# Patient Record
Sex: Male | Born: 1970 | Race: Black or African American | Hispanic: No | Smoking: Former smoker
Health system: Southern US, Community
[De-identification: ages and names within clinical notes are randomized; demographics above are authoritative.]

## PROBLEM LIST (undated history)

## (undated) DIAGNOSIS — R7309 Other abnormal glucose: Secondary | ICD-10-CM

## (undated) DIAGNOSIS — J309 Allergic rhinitis, unspecified: Secondary | ICD-10-CM

## (undated) DIAGNOSIS — M545 Low back pain, unspecified: Secondary | ICD-10-CM

## (undated) DIAGNOSIS — I10 Essential (primary) hypertension: Secondary | ICD-10-CM

## (undated) DIAGNOSIS — E7800 Pure hypercholesterolemia, unspecified: Secondary | ICD-10-CM

## (undated) DIAGNOSIS — M1990 Unspecified osteoarthritis, unspecified site: Secondary | ICD-10-CM

## (undated) HISTORY — DX: Low back pain: M54.5

## (undated) HISTORY — PX: NO PAST SURGERIES: SHX2092

## (undated) HISTORY — DX: Low back pain, unspecified: M54.50

## (undated) HISTORY — PX: COLONOSCOPY: SHX174

## (undated) HISTORY — DX: Allergic rhinitis, unspecified: J30.9

## (undated) HISTORY — DX: Unspecified osteoarthritis, unspecified site: M19.90

---

## 2006-06-02 ENCOUNTER — Ambulatory Visit: Payer: Self-pay | Admitting: Gastroenterology

## 2006-06-12 ENCOUNTER — Ambulatory Visit: Payer: Self-pay | Admitting: Gastroenterology

## 2006-06-12 ENCOUNTER — Encounter (INDEPENDENT_AMBULATORY_CARE_PROVIDER_SITE_OTHER): Payer: Self-pay | Admitting: *Deleted

## 2006-07-14 ENCOUNTER — Ambulatory Visit: Payer: Self-pay | Admitting: Gastroenterology

## 2007-08-06 ENCOUNTER — Emergency Department (HOSPITAL_COMMUNITY): Admission: EM | Admit: 2007-08-06 | Discharge: 2007-08-06 | Payer: Self-pay | Admitting: Emergency Medicine

## 2010-08-30 NOTE — Assessment & Plan Note (Signed)
[facility]                         GASTROENTEROLOGY OFFICE NOTE   [name] [name]                          MRN:          [id number]  DATE:06/02/2006                            DOB:          11-08-1970    REFERRING PHYSICIAN:  [name]   REASON FOR REFERRAL:  Physician assistant [name] [name] asked me to evaluate  [name] in consultation regarding intermittent rectal bleeding.   HISTORY OF PRESENT ILLNESS:  [name] is a very pleasant 39 year old  [place] born man who has had intermittent bright red blood per rectum  for the past month or so.  He says he does once in a while have to  strain to move his bowels but this is not too common. He has not had  diarrhea nor dramatic constipation.  He began having what sounded like  small volume bleeding on the tissue paper and a drip or two into the  toilet at the time of bowel movements.  He has never had this before.  He did have some abdominal pains in 2007 and was evaluated by a doctor  in [place] with an EGD.  I have a report saying that some mild  gastritis was seen on this May 2007 EGD.  He also had an ultrasound in  April 2007 that was essentially normal.   He feels very well, has had no melena, no hematochezia, no nausea, no  vomiting.   REVIEW OF SYSTEMS:  Noted for a 20 pound weight gain in the past year  and is otherwise essentially normal and is available on his nursing  intake sheet.   PAST MEDICAL HISTORY:  Hypertension, elevated cholesterol.   CURRENT MEDICINES:  Lipitor.   ALLERGIES:  No known drug allergies.   SOCIAL HISTORY:  Separated from his wife.  Lives by himself.  Nonsmoker,  drinks alcohol once or twice a week.   FAMILY HISTORY:  Mother with diabetes.  No colon cancer in family.   PHYSICAL EXAMINATION:  Height 6 foot, 0 inches, 215 pounds. Blood  pressure 120/90.  Pulse 64.  CONSTITUTIONAL:  Generally well-appearing.  NEUROLOGIC:  Alert  and oriented x3.  EYES:  Extraocular movements intact.  MOUTH:  Oropharynx moist, no lesions.  NECK:  Is supple, no lymphadenopathy.  CARDIOVASCULAR:  Heart regular rate and rhythm.  LUNGS:  Clear to auscultation bilaterally.  ABDOMEN:  Soft, nontender, nondistended, normal bowel sounds.  EXTREMITIES:  No lower extremity edema.  SKIN:  No rashes or lesions on visible extremities.   ASSESSMENT AND PLAN:  A 40 year old man with minor rectal bleeding.   We have not yet received CBC from the [facility].  He does not  appear anemic, but I want to document that to make sure.  Most likely  this is hemorrhoidal bleeding or possibly a small anal fissure.  To be  certain we should proceed with colonoscopy at his soonest convenience.  He has already started some type of an herbal supplement that his mother  recommended and he says on this he has seen definitely less bleeding.  I see no reason for any further blood tests or imaging studies as long  as we get the CBC above, if not, I think we should document whether or  not he is anemic.     [name] Fee, MD  Electronically Signed    DPJ/MedQ  DD: 06/02/2006  DT: 06/03/2006  Job #: [id number]   cc:   A. [name] [name], [place]

## 2010-08-30 NOTE — Assessment & Plan Note (Signed)
[facility]                         GASTROENTEROLOGY OFFICE NOTE   NAME:[name], [name]                          MRN:          [id number]  DATE:07/14/2006                            DOB:          1970-10-31    GI PROBLEM LIST:  1. Mild proctitis up to 5 cm from anus. Biopsy showed chronic active      proctocolitis this was on colonoscopy February 2008, done for      intermittent rectal bleeding. Canasa once nightly greatly helped.   INTERVAL HISTORY:  I last saw [name] at the time of his colonoscopy.  I found 5 cm of proctitis as described above. Biopsies proved chronic  component. His symptoms greatly improved with Canasa suppositories once  nightly.   CURRENT MEDICATIONS:  Canasa 1 gram p.r. q nightly.   PHYSICAL EXAMINATION:  Blood pressure is 118/84, pulse 72.  CONSTITUTIONAL: Generally well-appearing.  ABDOMEN: Soft and nontender, nondistended. Normal bowel sounds.   ASSESSMENT/PLAN:  A 40 year old man with 5-cm of proctitis.   Symptoms have resolved on once nightly Canasa. He will continue Canasa  suppositories every other night for a month and then back down to every  third night for a month. He will stop at that point. If he has a return  of his bleeding, he will call me and likely I will put him back on every  night Canasa. Otherwise, he will return to see me on an as needed basis.     Rachael Fee, MD  Electronically Signed    DPJ/MedQ  DD: 07/14/2006  DT: 07/14/2006  Job #: [id number]

## 2011-07-03 ENCOUNTER — Ambulatory Visit: Payer: Self-pay | Admitting: Physician Assistant

## 2011-08-28 ENCOUNTER — Ambulatory Visit: Payer: 59

## 2011-08-28 ENCOUNTER — Ambulatory Visit (INDEPENDENT_AMBULATORY_CARE_PROVIDER_SITE_OTHER): Payer: 59 | Admitting: Family Medicine

## 2011-08-28 VITALS — BP 120/86 | HR 71 | Temp 97.6°F | Resp 16 | Ht 77.0 in | Wt 238.0 lb

## 2011-08-28 DIAGNOSIS — M79606 Pain in leg, unspecified: Secondary | ICD-10-CM | POA: Insufficient documentation

## 2011-08-28 DIAGNOSIS — K639 Disease of intestine, unspecified: Secondary | ICD-10-CM

## 2011-08-28 DIAGNOSIS — M25569 Pain in unspecified knee: Secondary | ICD-10-CM

## 2011-08-28 DIAGNOSIS — K6389 Other specified diseases of intestine: Secondary | ICD-10-CM

## 2011-08-28 DIAGNOSIS — M25559 Pain in unspecified hip: Secondary | ICD-10-CM

## 2011-08-28 MED ORDER — OXAPROZIN 600 MG PO TABS: 600.0000 mg | ORAL_TABLET | Freq: Two times a day (BID) | ORAL | Status: AC

## 2011-08-28 NOTE — Patient Instructions (Addendum)
USE MIRALAX ONE DOSE AS NEEDED FOR THE ABDOMINAL PAIN TO MAKE YOUR BOWELS MOVE   BELOW ARE SOME GENERAL KNEE EXERCISES.  GRADUALLY INCREASE WHAT YOU DO.  Knee Exercises EXERCISES RANGE OF MOTION(ROM) AND STRETCHING EXERCISES These exercises may help you when beginning to rehabilitate your injury. Your symptoms may resolve with or without further involvement from your physician, physical therapist or athletic trainer. While completing these exercises, remember:   Restoring tissue flexibility helps normal motion to return to the joints. This allows healthier, less painful movement and activity.   An effective stretch should be held for at least 30 seconds.   A stretch should never be painful. You should only feel a gentle lengthening or release in the stretched tissue.  STRETCH - Knee Extension, Prone  Lie on your stomach on a firm surface, such as a bed or countertop. Place your right / left knee and leg just beyond the edge of the surface. You may wish to place a towel under the far end of your right / left thigh for comfort.   Relax your leg muscles and allow gravity to straighten your knee. Your clinician may advise you to add an ankle weight if more resistance is helpful for you.   You should feel a stretch in the back of your right / left knee. Hold this position for __________ seconds.  Repeat __________ times. Complete this stretch __________ times per day. * Your physician, physical therapist or athletic trainer may ask you to add ankle weight to enhance your stretch.  RANGE OF MOTION - Knee Flexion, Active  Lie on your back with both knees straight. (If this causes back discomfort, bend your opposite knee, placing your foot flat on the floor.)   Slowly slide your heel back toward your buttocks until you feel a gentle stretch in the front of your knee or thigh.   Hold for __________ seconds. Slowly slide your heel back to the starting position.  Repeat __________ times.  Complete this exercise __________ times per day.  STRETCH - Quadriceps, Prone   Lie on your stomach on a firm surface, such as a bed or padded floor.   Bend your right / left knee and grasp your ankle. If you are unable to reach, your ankle or pant leg, use a belt around your foot to lengthen your reach.   Gently pull your heel toward your buttocks. Your knee should not slide out to the side. You should feel a stretch in the front of your thigh and/or knee.   Hold this position for __________ seconds.  Repeat __________ times. Complete this stretch __________ times per day.  STRETCH - Hamstrings, Supine   Lie on your back. Loop a belt or towel over the ball of your right / left foot.   Straighten your right / left knee and slowly pull on the belt to raise your leg. Do not allow the right / left knee to bend. Keep your opposite leg flat on the floor.   Raise the leg until you feel a gentle stretch behind your right / left knee or thigh. Hold this position for __________ seconds.  Repeat __________ times. Complete this stretch __________ times per day.  STRENGTHENING EXERCISES These exercises may help you when beginning to rehabilitate your injury. They may resolve your symptoms with or without further involvement from your physician, physical therapist or athletic trainer. While completing these exercises, remember:   Muscles can gain both the endurance and the strength needed for  everyday activities through controlled exercises.   Complete these exercises as instructed by your physician, physical therapist or athletic trainer. Progress the resistance and repetitions only as guided.   You may experience muscle soreness or fatigue, but the pain or discomfort you are trying to eliminate should never worsen during these exercises. If this pain does worsen, stop and make certain you are following the directions exactly. If the pain is still present after adjustments, discontinue the exercise  until you can discuss the trouble with your clinician.  STRENGTH - Quadriceps, Isometrics  Lie on your back with your right / left leg extended and your opposite knee bent.   Gradually tense the muscles in the front of your right / left thigh. You should see either your knee cap slide up toward your hip or increased dimpling just above the knee. This motion will push the back of the knee down toward the floor/mat/bed on which you are lying.   Hold the muscle as tight as you can without increasing your pain for __________ seconds.   Relax the muscles slowly and completely in between each repetition.  Repeat __________ times. Complete this exercise __________ times per day.  STRENGTH - Quadriceps, Short Arcs   Lie on your back. Place a __________ inch towel roll under your knee so that the knee slightly bends.   Raise only your lower leg by tightening the muscles in the front of your thigh. Do not allow your thigh to rise.   Hold this position for __________ seconds.  Repeat __________ times. Complete this exercise __________ times per day.  OPTIONAL ANKLE WEIGHTS: Begin with ____________________, but DO NOT exceed ____________________. Increase in 1 pound/0.5 kilogram increments.  STRENGTH - Quadriceps, Straight Leg Raises  Quality counts! Watch for signs that the quadriceps muscle is working to insure you are strengthening the correct muscles and not "cheating" by substituting with healthier muscles.  Lay on your back with your right / left leg extended and your opposite knee bent.   Tense the muscles in the front of your right / left thigh. You should see either your knee cap slide up or increased dimpling just above the knee. Your thigh may even quiver.   Tighten these muscles even more and raise your leg 4 to 6 inches off the floor. Hold for __________ seconds.   Keeping these muscles tense, lower your leg.   Relax the muscles slowly and completely in between each repetition.    Repeat __________ times. Complete this exercise __________ times per day.  STRENGTH - Hamstring, Curls  Lay on your stomach with your legs extended. (If you lay on a bed, your feet may hang over the edge.)   Tighten the muscles in the back of your thigh to bend your right / left knee up to 90 degrees. Keep your hips flat on the bed/floor.   Hold this position for __________ seconds.   Slowly lower your leg back to the starting position.  Repeat __________ times. Complete this exercise __________ times per day.  OPTIONAL ANKLE WEIGHTS: Begin with ____________________, but DO NOT exceed ____________________. Increase in 1 pound/0.5 kilogram increments.  STRENGTH - Quadriceps, Squats  Stand in a door frame so that your feet and knees are in line with the frame.   Use your hands for balance, not support, on the frame.   Slowly lower your weight, bending at the hips and knees. Keep your lower legs upright so that they are parallel with the door frame. Squat  only within the range that does not increase your knee pain. Never let your hips drop below your knees.   Slowly return upright, pushing with your legs, not pulling with your hands.  Repeat __________ times. Complete this exercise __________ times per day.  STRENGTH - Quadriceps, Wall Slides  Follow guidelines for form closely. Increased knee pain often results from poorly placed feet or knees.  Lean against a smooth wall or door and walk your feet out 18-24 inches. Place your feet hip-width apart.   Slowly slide down the wall or door until your knees bend __________ degrees.* Keep your knees over your heels, not your toes, and in line with your hips, not falling to either side.   Hold for __________ seconds. Stand up to rest for __________ seconds in between each repetition.  Repeat __________ times. Complete this exercise __________ times per day. * Your physician, physical therapist or athletic trainer will alter this angle based on  your symptoms and progress. Document Released: 02/12/2005 Document Revised: 03/20/2011 Document Reviewed: 07/13/2008 [facility] Patient Information 2012 [place], [place].

## 2011-08-28 NOTE — Progress Notes (Signed)
Subjective: Left knee is hurting the patient. He does not do a lot of vigorous physical activity more. It hurts when he stands for a while. Getting up and down does not seem to cause him a lot of trouble. It is not giving him trouble when he is laying down. He worries some about his left hip because it also is a little tight. 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 He does well at that.  Objective: Left hip has decreased abduction. It's a little bit painful. The left knee hurts him in certain positions, but is nontender. There is one possibility flexes the knee.  Assessment: Left knee pain Left hip pain and decreased motion  Plan: X-ray knee and hip  [facility] reading (PRIMARY) by  Dr. Alwyn Ren  Hip on left is okay.  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   Left knee normal  He knows hip looks bad but doen't desire rx since it doesn't hurt NSAID for knee.

## 2013-11-01 ENCOUNTER — Ambulatory Visit (INDEPENDENT_AMBULATORY_CARE_PROVIDER_SITE_OTHER): Payer: 59 | Admitting: Family Medicine

## 2013-11-01 VITALS — BP 108/80 | HR 73 | Temp 98.6°F | Resp 16 | Ht 76.0 in | Wt 234.0 lb

## 2013-11-01 DIAGNOSIS — R05 Cough: Secondary | ICD-10-CM

## 2013-11-01 DIAGNOSIS — M545 Low back pain, unspecified: Secondary | ICD-10-CM

## 2013-11-01 DIAGNOSIS — G8929 Other chronic pain: Secondary | ICD-10-CM | POA: Insufficient documentation

## 2013-11-01 DIAGNOSIS — R059 Cough, unspecified: Secondary | ICD-10-CM

## 2013-11-01 MED ORDER — BENZONATATE 100 MG PO CAPS: 100.0000 mg | ORAL_CAPSULE | Freq: Three times a day (TID) | ORAL | Status: DC | PRN

## 2013-11-01 MED ORDER — DICLOFENAC SODIUM 75 MG PO TBEC: 75.0000 mg | DELAYED_RELEASE_TABLET | Freq: Two times a day (BID) | ORAL | Status: DC

## 2013-11-01 MED ORDER — ALBUTEROL SULFATE HFA 108 (90 BASE) MCG/ACT IN AERS: 2.0000 | INHALATION_SPRAY | Freq: Four times a day (QID) | RESPIRATORY_TRACT | Status: DC | PRN

## 2013-11-01 NOTE — Patient Instructions (Signed)
Use the inhaler 2 inhalations every 4-6 hours  Read the booklet on back on her manual  Take diclofenac one twice daily  Call if worse

## 2013-11-01 NOTE — Progress Notes (Signed)
Subjective: Patient is here for a couple of things. He's had a cough for about 4 days. He's not having a production of it. It mostly as a daytime cough but very nagging. No fever. No sore throat. No upper congestion.  He also has been having problems with pain in his low back. Especially when he sits all day at work for 12 hours he is hurting there.  Objective: Tender in the low lumbar spine midline right at the base of the spine. Good range of motion. Deep tendon reflexes and normal. Throat are negative.  TMs are normal. Throat clear. Neck supple without nodes. Chest clear. Mild prolonged expiratory phase. Heart regular without murmurs.  Assessment: Cough with mild bronchospasm Back pain  Plan: Diclofenac one twice daily Tessalon for the cough Care of back instructions If he starts to bring up a lot of phlegm and calls back I will call in an antibiotic for him, but told him he did not eat today.

## 2013-11-11 ENCOUNTER — Telehealth: Payer: Self-pay

## 2013-11-11 NOTE — Telephone Encounter (Signed)
Dr. Linna Darner    Patient states he is no better and requests antibiotics.   [facility] [address]   [phone]

## 2013-11-13 MED ORDER — MUCINEX DM MAXIMUM STRENGTH 60-1200 MG PO TB12: 1.0000 | ORAL_TABLET | Freq: Two times a day (BID) | ORAL | Status: DC

## 2013-11-13 MED ORDER — AZITHROMYCIN 250 MG PO TABS: ORAL_TABLET | ORAL | Status: AC

## 2013-11-13 NOTE — Telephone Encounter (Signed)
I have sent abx to the pharmacy for the patient.  I also sent some Mucinex DM to help get the congestion out of the chest.

## 2015-03-29 ENCOUNTER — Ambulatory Visit (INDEPENDENT_AMBULATORY_CARE_PROVIDER_SITE_OTHER): Payer: 59 | Admitting: Emergency Medicine

## 2015-03-29 VITALS — BP 122/84 | HR 65 | Temp 98.3°F | Resp 16 | Ht 76.5 in | Wt 244.0 lb

## 2015-03-29 DIAGNOSIS — Z23 Encounter for immunization: Secondary | ICD-10-CM

## 2015-03-29 DIAGNOSIS — J0140 Acute pansinusitis, unspecified: Secondary | ICD-10-CM

## 2015-03-29 DIAGNOSIS — M7552 Bursitis of left shoulder: Secondary | ICD-10-CM

## 2015-03-29 DIAGNOSIS — M7550 Bursitis of unspecified shoulder: Secondary | ICD-10-CM | POA: Insufficient documentation

## 2015-03-29 MED ORDER — AMOXICILLIN-POT CLAVULANATE 875-125 MG PO TABS: 1.0000 | ORAL_TABLET | Freq: Two times a day (BID) | ORAL | Status: DC

## 2015-03-29 MED ORDER — PSEUDOEPHEDRINE-GUAIFENESIN ER 60-600 MG PO TB12: 1.0000 | ORAL_TABLET | Freq: Two times a day (BID) | ORAL | Status: DC

## 2015-03-29 MED ORDER — NAPROXEN SODIUM 550 MG PO TABS: 550.0000 mg | ORAL_TABLET | Freq: Two times a day (BID) | ORAL | Status: AC

## 2015-03-29 NOTE — Progress Notes (Signed)
Subjective:  Patient ID: [name] [name], male    DOB: 01-07-1971  Age: 44 y.o. MRN: [id number]  CC: Shoulder Pain   HPI [name] [name] presents   With nasal congestion postnasal drainage  Pertinent with purulent nasal drainage. He has a sore throat.. No cough fever chills nausea vomiting. 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 No history of direct injury but he does  Very physical work out said the pain in his shoulder for weeks  History [name] [name] has no past medical history on file.   He has no past surgical history on file.   His  family history is not on file.  He   reports that he has never smoked. He does not have any smokeless tobacco history on file. He reports that he drinks alcohol. His drug history is not on file.  Outpatient Prescriptions Prior to Visit  Medication Sig Dispense Refill  . albuterol (PROVENTIL HFA;VENTOLIN HFA) 108 (90 BASE) MCG/ACT inhaler Inhale 2 puffs into the lungs every 6 (six) hours as needed for wheezing or shortness of breath (cough, shortness of breath or wheezing.). (Patient not taking: Reported on 03/29/2015) 1 Inhaler 1  . benzonatate (TESSALON) 100 MG capsule Take 1-2 capsules (100-200 mg total) by mouth 3 (three) times daily as needed. (Patient not taking: Reported on 03/29/2015) 30 capsule 0  . Dextromethorphan-Guaifenesin (MUCINEX DM MAXIMUM STRENGTH) 60-1200 MG TB12 Take 1 tablet by mouth 2 (two) times daily. (Patient not taking: Reported on 03/29/2015) 14 each 0  . diclofenac (VOLTAREN) 75 MG EC tablet Take 1 tablet (75 mg total) by mouth 2 (two) times daily. (Patient not taking: Reported on 03/29/2015) 30 tablet 0   No facility-administered medications prior to visit.    Social History   Social History  . Marital Status: Single    Spouse Name: N/A  . Number of Children: N/A  . Years of Education: N/A   Social History Main Topics  . Smoking status: Never Smoker   . Smokeless tobacco: None  .  Alcohol Use: Yes     Comment: social  . Drug Use: None  . Sexual Activity: Not Asked   Other Topics Concern  . None   Social History Narrative     Review of Systems  Constitutional: Negative for fever, chills and appetite change.  HENT: Positive for congestion, postnasal drip, rhinorrhea, sinus pressure and sore throat. Negative for ear pain.   Eyes: Negative for pain and redness.  Respiratory: Negative for cough, shortness of breath and wheezing.   Cardiovascular: Negative for leg swelling.  Gastrointestinal: Negative for nausea, vomiting, abdominal pain, diarrhea, constipation and blood in stool.  Endocrine: Negative for polyuria.  Genitourinary: Negative for dysuria, urgency, frequency and flank pain.  Musculoskeletal: Negative for gait problem.  Skin: Negative for rash.  Neurological: Negative for weakness and headaches.  Psychiatric/Behavioral: Negative for confusion and decreased concentration. The patient is not nervous/anxious.     Objective:  BP 122/84 mmHg  Pulse 65  Temp(Src) 98.3 F (36.8 C)  Resp 16  Ht 6' 4.5" (1.943 m)  Wt 244 lb (110.678 kg)  BMI 29.32 kg/m2  SpO2 98%  Physical Exam  Constitutional: He is oriented to person, place, and time. He appears well-developed and well-nourished.  HENT:  Head: Normocephalic and atraumatic.  Eyes: Conjunctivae are normal. Pupils are equal, round, and reactive to light.  Pulmonary/Chest: Effort normal.  Musculoskeletal: He exhibits no edema.  Left shoulder: He exhibits decreased range of motion and tenderness.  Neurological: He is alert and oriented to person, place, and time.  Skin: Skin is dry.  Psychiatric: He has a normal mood and affect. His behavior is normal. Thought content normal.      Assessment & Plan:   [name] [name] was seen today for shoulder pain.  Diagnoses and all orders for this visit:  Acute pansinusitis, recurrence not specified  Shoulder bursitis, left  Needs flu shot -     Flu  Vaccine QUAD 36+ mos IM  Other orders -     naproxen sodium (ANAPROX DS) 550 MG tablet; Take 1 tablet (550 mg total) by mouth 2 (two) times daily with a meal. -     amoxicillin-clavulanate (AUGMENTIN) 875-125 MG tablet; Take 1 tablet by mouth 2 (two) times daily. -     pseudoephedrine-guaifenesin (MUCINEX D) 60-600 MG 12 hr tablet; Take 1 tablet by mouth every 12 (twelve) hours.  I am having [name] [name] start on naproxen sodium, amoxicillin-clavulanate, and pseudoephedrine-guaifenesin. I am also having him maintain his albuterol, benzonatate, diclofenac, and MUCINEX DM MAXIMUM STRENGTH.  Meds ordered this encounter  Medications  . naproxen sodium (ANAPROX DS) 550 MG tablet    Sig: Take 1 tablet (550 mg total) by mouth 2 (two) times daily with a meal.    Dispense:  40 tablet    Refill:  0  . amoxicillin-clavulanate (AUGMENTIN) 875-125 MG tablet    Sig: Take 1 tablet by mouth 2 (two) times daily.    Dispense:  20 tablet    Refill:  0  . pseudoephedrine-guaifenesin (MUCINEX D) 60-600 MG 12 hr tablet    Sig: Take 1 tablet by mouth every 12 (twelve) hours.    Dispense:  18 tablet    Refill:  0    Appropriate red flag conditions were discussed with the patient as well as actions that should be taken.  Patient expressed his understanding.  Follow-up: Return if symptoms worsen or fail to improve.  [name] Culver, MD

## 2015-03-29 NOTE — Patient Instructions (Signed)
Bursitis °Bursitis is inflammation and irritation of a bursa, which is one of the small, fluid-filled sacs that cushion and protect the moving parts of your body. These sacs are located between bones and muscles, muscle attachments, or skin areas next to bones. A bursa protects these structures from the wear and tear that results from frequent movement. °An inflamed bursa causes pain and swelling. Fluid may build up inside the sac. Bursitis is most common near joints, especially the knees, elbows, hips, and shoulders. °CAUSES °Bursitis can be caused by:  °· Injury from: °¨ A direct blow, like falling on your knee or elbow. °¨ Overuse of a joint (repetitive stress). °· Infection. This can happen if bacteria gets into a bursa through a cut or scrape near a joint. °· Diseases that cause joint inflammation, such as gout and rheumatoid arthritis. °RISK FACTORS °You may be at risk for bursitis if you:  °· Have a job or hobby that involves a lot of repetitive stress on your joints. °· Have a condition that weakens your body's defense system (immune system), such as diabetes, cancer, or HIV. °· Lift and reach overhead often. °· Kneel or lean on hard surfaces often. °· Run or walk often. °SIGNS AND SYMPTOMS °The most common signs and symptoms of bursitis are: °· Pain that gets worse when you move the affected body part or put weight on it. °· Inflammation. °· Stiffness. °Other signs and symptoms may include: °· Redness. °· Tenderness. °· Warmth. °· Pain that continues after rest. °· Fever and chills. This may occur in bursitis caused by infection. °DIAGNOSIS °Bursitis may be diagnosed by:  °· Medical history and physical exam. °· MRI. °· A procedure to drain fluid from the bursa with a needle (aspiration). The fluid may be checked for signs of infection or gout. °· Blood tests to rule out other causes of inflammation. °TREATMENT  °Bursitis can usually be treated at home with rest, ice, compression, and elevation (RICE). For  mild bursitis, RICE treatment may be all you need. Other treatments may include: °· Nonsteroidal anti-inflammatory drugs (NSAIDs) to treat pain and inflammation. °· Corticosteroids to fight inflammation. You may have these drugs injected into and around the area of bursitis. °· Aspiration of bursitis fluid to relieve pain and improve movement. °· Antibiotic medicine to treat an infected bursa. °· A splint, brace, or walking aid. °· Physical therapy if you continue to have pain or limited movement. °· Surgery to remove a damaged or infected bursa. This may be needed if you have a very bad case of bursitis or if other treatments have not worked. °HOME CARE INSTRUCTIONS  °· Take medicines only as directed by your health care provider. °· If you were prescribed an antibiotic medicine, finish it all even if you start to feel better. °· Rest the affected area as directed by your health care provider. °¨ Keep the area elevated. °¨ Avoid activities that make pain worse. °· Apply ice to the injured area: °¨ Place ice in a plastic bag. °¨ Place a towel between your skin and the bag. °¨ Leave the ice on for 20 minutes, 2-3 times a day. °· Use splints, braces, pads, or walking aids as directed by your health care provider. °· Keep all follow-up visits as directed by your health care provider. This is important. °PREVENTION  °· Wear knee pads if you kneel often. °· Wear sturdy running or walking shoes that fit you well. °· Take regular breaks from repetitive activity. °· Warm   up by stretching before doing any strenuous activity. °· Maintain a healthy weight or lose weight as recommended by your health care provider. Ask your health care provider if you need help. °· Exercise regularly. Start any new physical activity gradually. °SEEK MEDICAL CARE IF:  °· Your bursitis is not responding to treatment or home care. °· You have a fever. °· You have chills. °  °This information is not intended to replace advice given to you by your  health care provider. Make sure you discuss any questions you have with your health care provider. °  °Document Released: 03/28/2000 Document Revised: 12/20/2014 Document Reviewed: 06/20/2013 °Elsevier Interactive Patient Education ©2016 [employer] ° °

## 2015-08-17 ENCOUNTER — Ambulatory Visit (INDEPENDENT_AMBULATORY_CARE_PROVIDER_SITE_OTHER): Payer: 59 | Admitting: Family Medicine

## 2015-08-17 VITALS — BP 144/84 | HR 78 | Temp 98.5°F | Resp 16 | Ht 76.5 in | Wt 239.2 lb

## 2015-08-17 DIAGNOSIS — M25559 Pain in unspecified hip: Secondary | ICD-10-CM | POA: Diagnosis not present

## 2015-08-17 MED ORDER — PREDNISONE 5 MG (48) PO TBPK: ORAL_TABLET | ORAL | Status: DC

## 2015-08-17 NOTE — Patient Instructions (Addendum)
Thank you for coming in today. Take prednisone daily for 12 days.  Return if not better.  Come back or go to the emergency room if you notice new weakness new numbness problems walking or bowel or bladder problems.      IF you received an x-ray today, you will receive an invoice from [facility]. Please contact [facility] at [phone] with questions or concerns regarding your invoice.   IF you received labwork today, you will receive an invoice from [employer]. Please contact [facility] at [phone] with questions or concerns regarding your invoice.   Our billing staff will not be able to assist you with questions regarding bills from these companies.  You will be contacted with the lab results as soon as they are available. The fastest way to get your results is to activate your My Chart account. Instructions are located on the last page of this paperwork. If you have not heard from [place] regarding the results in 2 weeks, please contact this office.

## 2015-08-17 NOTE — Progress Notes (Signed)
[name] [name] is a 45 y.o. male who presents to Urgent Care today for back pain. Patient has bilateral thigh pain. He denies any pain radiating below his knee weakness or numbness bowel bladder dysfunction or difficulty walking. He has not tried any medicines aside from acetaminophen. Symptoms are worse with prolonged standing and laying down. He feels well otherwise.   No past medical history on file. No past surgical history on file. Social History  Substance Use Topics  . Smoking status: Never Smoker   . Smokeless tobacco: Not on file  . Alcohol Use: Yes     Comment: social   ROS as above Medications: Current Outpatient Prescriptions  Medication Sig Dispense Refill  . naproxen sodium (ANAPROX DS) 550 MG tablet Take 1 tablet (550 mg total) by mouth 2 (two) times daily with a meal. 40 tablet 0  . predniSONE (STERAPRED UNI-PAK 48 TAB) 5 MG (48) TBPK tablet 12 day dosepack po 48 tablet 0   No current facility-administered medications for this visit.   No Known Allergies   Exam:  BP 144/84 mmHg  Pulse 78  Temp(Src) 98.5 F (36.9 C) (Oral)  Resp 16  Ht 6' 4.5" (1.943 m)  Wt 239 lb 3.2 oz (108.5 kg)  BMI 28.74 kg/m2  SpO2 98% Gen: Well NAD Back: Nontender to midline normal back motion Negative slump test. Lower extremity strength is equal and normal throughout. Lower extremity reflexes are equal and normal throughout. Sensation is intact throughout. Normal gait  No results found for this or any previous visit (from the past 24 hour(s)). No results found.  Assessment and Plan: 45 y.o. male with lumbago myofascial pain versus radiculopathy. Discussed options. Plan for trial of low-dose prednisone Dosepak. Return if not improved.  Discussed warning signs or symptoms. Please see discharge instructions. Patient expresses understanding.

## 2016-09-06 DIAGNOSIS — H04123 Dry eye syndrome of bilateral lacrimal glands: Secondary | ICD-10-CM | POA: Diagnosis not present

## 2016-09-06 DIAGNOSIS — H40033 Anatomical narrow angle, bilateral: Secondary | ICD-10-CM | POA: Diagnosis not present

## 2017-03-25 DIAGNOSIS — I7300 Raynaud's syndrome without gangrene: Secondary | ICD-10-CM | POA: Diagnosis not present

## 2017-07-01 ENCOUNTER — Other Ambulatory Visit: Payer: Self-pay

## 2017-07-01 ENCOUNTER — Encounter (HOSPITAL_COMMUNITY): Payer: Self-pay | Admitting: Emergency Medicine

## 2017-07-01 ENCOUNTER — Ambulatory Visit (HOSPITAL_COMMUNITY)
Admission: EM | Admit: 2017-07-01 | Discharge: 2017-07-01 | Disposition: A | Discharge status: Home / Self Care | Payer: 59 | Attending: Family Medicine | Admitting: Family Medicine

## 2017-07-01 DIAGNOSIS — Z79899 Other long term (current) drug therapy: Secondary | ICD-10-CM | POA: Diagnosis not present

## 2017-07-01 DIAGNOSIS — J069 Acute upper respiratory infection, unspecified: Secondary | ICD-10-CM | POA: Insufficient documentation

## 2017-07-01 DIAGNOSIS — R1084 Generalized abdominal pain: Secondary | ICD-10-CM | POA: Diagnosis not present

## 2017-07-01 DIAGNOSIS — B9789 Other viral agents as the cause of diseases classified elsewhere: Secondary | ICD-10-CM | POA: Diagnosis not present

## 2017-07-01 DIAGNOSIS — R197 Diarrhea, unspecified: Secondary | ICD-10-CM | POA: Diagnosis present

## 2017-07-01 DIAGNOSIS — R05 Cough: Secondary | ICD-10-CM | POA: Insufficient documentation

## 2017-07-01 LAB — POCT RAPID STREP A: Streptococcus, Group A Screen (Direct): NEGATIVE

## 2017-07-01 MED ORDER — CETIRIZINE HCL 10 MG PO CAPS: 10.0000 mg | ORAL_CAPSULE | Freq: Every day | ORAL | 0 refills | Status: DC

## 2017-07-01 MED ORDER — PSEUDOEPH-BROMPHEN-DM 30-2-10 MG/5ML PO SYRP: 5.0000 mL | ORAL_SOLUTION | Freq: Four times a day (QID) | ORAL | 0 refills | Status: AC | PRN

## 2017-07-01 MED ORDER — AMOXICILLIN-POT CLAVULANATE 875-125 MG PO TABS: 1.0000 | ORAL_TABLET | Freq: Two times a day (BID) | ORAL | 0 refills | Status: AC

## 2017-07-01 MED ORDER — FLUTICASONE PROPIONATE 50 MCG/ACT NA SUSP: 1.0000 | Freq: Every day | NASAL | 0 refills | Status: DC

## 2017-07-01 NOTE — Discharge Instructions (Signed)
Please begin daily zyrtec or claritin, store brand okay. You may try mucinex or flonase for congestion relief as well.   Please use cough syrup as needed.  You may fill antibiotic in 4-5 days if symptoms not improving.   Continue to monitor abdominal pain, take tylenol for this. Return if pain worsening, changing or not improving in 1 week.

## 2017-07-01 NOTE — ED Triage Notes (Signed)
Pt. No answer x 3

## 2017-07-01 NOTE — ED Provider Notes (Signed)
[facility]    CSN: [id number] Arrival date & time: 07/01/17  1549     History   Chief Complaint Chief Complaint  Patient presents with  . Diarrhea  . Facial Pain    HPI [name] [name] is a 47 y.o. male.  Presenting today with concern for congestion and abdominal pain.  Over the past 3-4 days he has had sinus pressure and congestion along with a sore throat.  His symptoms began last Friday.  He has tried Robitussin, Alka-Seltzer, 2 tablets of amoxicillin from previous sinusitis.  Tolerating oral intake, denies nausea or vomiting.  Patient had one episode of diarrhea on Monday, followed by regular bowel movements the past 2 days.  Is having some abdominal cramping as if he is constipated across his middle abdomen on both sides.  States that it is mildly improved from yesterday.  Drinks plenty of water, takes daily fiber supplement. HPI  History reviewed. No pertinent past medical history.  Patient Active Problem List   Diagnosis Date Noted  . Pain in joint, pelvic region and thigh 08/17/2015    History reviewed. No pertinent surgical history.     Home Medications    Prior to Admission medications   Medication Sig Start Date End Date Taking? Authorizing Provider  amoxicillin-clavulanate (AUGMENTIN) 875-125 MG tablet Take 1 tablet by mouth every 12 (twelve) hours for 10 days. 07/01/17 07/11/17  Donnie Gedeon C, PA-C  brompheniramine-pseudoephedrine-DM 30-2-10 MG/5ML syrup Take 5 mLs by mouth 4 (four) times daily as needed for up to 5 days. 07/01/17 07/06/17  Adalynd Donahoe C, PA-C  Cetirizine HCl 10 MG CAPS Take 1 capsule (10 mg total) by mouth daily for 10 days. 07/01/17 07/11/17  Nygeria Lager C, PA-C  fluticasone (FLONASE) 50 MCG/ACT nasal spray Place 1-2 sprays into both nostrils daily. 07/01/17   Haden Cavenaugh, Elesa Hacker, PA-C    Family History History reviewed. No pertinent family history.  Social History Social History   Tobacco Use  . Smoking status: Never  Smoker  . Smokeless tobacco: Never Used  Substance Use Topics  . Alcohol use: Yes    Comment: social  . Drug use: Not on file     Allergies   Patient has no known allergies.   Review of Systems Review of Systems  Constitutional: Negative for activity change, appetite change, fatigue and fever.  HENT: Positive for congestion, postnasal drip, rhinorrhea, sinus pressure and sore throat. Negative for ear pain.   Eyes: Negative for pain and itching.  Respiratory: Positive for cough. Negative for shortness of breath.   Cardiovascular: Negative for chest pain.  Gastrointestinal: Positive for abdominal pain. Negative for diarrhea, nausea and vomiting.  Musculoskeletal: Negative for myalgias.  Skin: Negative for rash.  Neurological: Negative for dizziness, light-headedness and headaches.     Physical Exam Triage Vital Signs ED Triage Vitals  Enc Vitals Group     BP 07/01/17 1859 (!) 146/107     Pulse Rate 07/01/17 1859 73     Resp --      Temp 07/01/17 1859 97.6 F (36.4 C)     Temp Source 07/01/17 1859 Oral     SpO2 07/01/17 1859 98 %     Weight --      Height --      Head Circumference --      Peak Flow --      Pain Score 07/01/17 1900 2     Pain Loc --      Pain Edu? --  Excl. in [place]? --    No data found.  Updated Vital Signs BP (!) 146/107 (BP Location: Left Arm)   Pulse 73   Temp 97.6 F (36.4 C) (Oral)   SpO2 98%   Visual Acuity Right Eye Distance:   Left Eye Distance:   Bilateral Distance:    Right Eye Near:   Left Eye Near:    Bilateral Near:     Physical Exam  Constitutional: He appears well-developed and well-nourished.  HENT:  Head: Normocephalic and atraumatic.  Bilateral TMs nonerythematous, nasal mucosa mildly erythematous with rhinorrhea present, posterior oropharynx erythematous with mild tonsillar enlargement, no exudate.  Eyes: Conjunctivae are normal.  Neck: Neck supple.  Cardiovascular: Normal rate and regular rhythm.  No murmur  heard. Pulmonary/Chest: Effort normal and breath sounds normal. No respiratory distress.  Breathing comfortably at rest, CTA BL  Abdominal: Soft. There is no tenderness.  Nontender to light and deep palpation  Musculoskeletal: He exhibits no edema.  Neurological: He is alert.  Skin: Skin is warm and dry.  Psychiatric: He has a normal mood and affect.  Nursing note and vitals reviewed.    UC Treatments / Results  Labs (all labs ordered are listed, but only abnormal results are displayed) Labs Reviewed  CULTURE, GROUP A STREP [facility])  POCT RAPID STREP A    EKG  EKG Interpretation None       Radiology No results found.  Procedures Procedures (including critical care time)  Medications Ordered in UC Medications - No data to display   Initial Impression / Assessment and Plan / UC Course  I have reviewed the triage vital signs and the nursing notes.  Pertinent labs & imaging results that were available during my care of the patient were reviewed by me and considered in my medical decision making (see chart for details).     Patient with likely viral URI symptoms.  Will recommend continuing symptomatic treatment.  Zyrtec, Flonase, cough syrup.  Patient requesting antibiotic, will provide printed prescription to fill in 5 days if symptoms not improving with recommendations today.  Abdominal pain does not appear peritoneal.  Slightly improved from earlier this week we will continue to monitor.  Will advise Tylenol to help with cramping. Discussed strict return precautions. Patient verbalized understanding and is agreeable with plan.   Final Clinical Impressions(s) / UC Diagnoses   Final diagnoses:  Generalized abdominal pain  Viral URI with cough    ED Discharge Orders        Ordered    Cetirizine HCl 10 MG CAPS  Daily     07/01/17 1926    brompheniramine-pseudoephedrine-DM 30-2-10 MG/5ML syrup  4 times daily PRN     07/01/17 1926    fluticasone (FLONASE) 50 MCG/ACT  nasal spray  Daily     07/01/17 1926    amoxicillin-clavulanate (AUGMENTIN) 875-125 MG tablet  Every 12 hours     07/01/17 1926       Controlled Substance Prescriptions [place] Controlled Substance Registry consulted? Not Applicable   Janith Lima, [place] 07/01/17 2132

## 2017-07-01 NOTE — ED Triage Notes (Signed)
C/o facial and sinus pressure and pain onset 2 days with chest congestion and cough. States started having diarrhea yesterday.

## 2017-07-04 LAB — CULTURE, GROUP A STREP (THRC)

## 2017-07-25 ENCOUNTER — Ambulatory Visit (HOSPITAL_COMMUNITY)
Admission: EM | Admit: 2017-07-25 | Discharge: 2017-07-25 | Disposition: A | Discharge status: Home / Self Care | Payer: 59 | Attending: Family Medicine | Admitting: Family Medicine

## 2017-07-25 ENCOUNTER — Encounter (HOSPITAL_COMMUNITY): Payer: Self-pay

## 2017-07-25 ENCOUNTER — Other Ambulatory Visit: Payer: Self-pay

## 2017-07-25 DIAGNOSIS — J320 Chronic maxillary sinusitis: Secondary | ICD-10-CM

## 2017-07-25 DIAGNOSIS — J019 Acute sinusitis, unspecified: Secondary | ICD-10-CM

## 2017-07-25 DIAGNOSIS — J302 Other seasonal allergic rhinitis: Secondary | ICD-10-CM

## 2017-07-25 MED ORDER — AZITHROMYCIN 250 MG PO TABS: ORAL_TABLET | ORAL | 0 refills | Status: DC

## 2017-07-25 NOTE — ED Provider Notes (Signed)
[facility]    CSN: [id number] Arrival date & time: 07/25/17  [place]     History   Chief Complaint Chief Complaint  Patient presents with  . URI    HPI [name] [name] is a 47 y.o. male.   Patient complains of sneezing coughing he has medicines left over from last time he was treated here including Augmentin Nasonex Allegra and Mucinex DM cough is mostly nonproductive but he is having drainage that I think may be affected by antihistamine causing it to become thicker with its drying effect.  HPI  History reviewed. No pertinent past medical history.  Patient Active Problem List   Diagnosis Date Noted  . Pain in joint, pelvic region and thigh 08/17/2015    History reviewed. No pertinent surgical history.     Home Medications    Prior to Admission medications   Medication Sig Start Date End Date Taking? Authorizing Provider  Cetirizine HCl 10 MG CAPS Take 1 capsule (10 mg total) by mouth daily for 10 days. 07/01/17 07/11/17  Wieters, Hallie C, PA-C  fluticasone (FLONASE) 50 MCG/ACT nasal spray Place 1-2 sprays into both nostrils daily. 07/01/17   Wieters, Elesa Hacker, PA-C    Family History History reviewed. No pertinent family history.  Social History Social History   Tobacco Use  . Smoking status: Never Smoker  . Smokeless tobacco: Never Used  Substance Use Topics  . Alcohol use: Yes    Comment: social  . Drug use: Not on file     Allergies   Patient has no known allergies.   Review of Systems Review of Systems  Constitutional: Negative.   HENT: Positive for congestion and postnasal drip.   Respiratory: Positive for cough.   Cardiovascular: Negative.   Gastrointestinal: Negative.   Neurological: Negative.      Physical Exam Triage Vital Signs ED Triage Vitals  Enc Vitals Group     BP 07/25/17 1725 (!) 140/99     Pulse Rate 07/25/17 1722 91     Resp 07/25/17 1722 18     Temp 07/25/17 1722 98 F (36.7 C)     Temp src --      SpO2  07/25/17 1722 100 %     Weight 07/25/17 1723 240 lb (108.9 kg)     Height --      Head Circumference --      Peak Flow --      Pain Score 07/25/17 1723 0     Pain Loc --      Pain Edu? --      Excl. in [place]? --    No data found.  Updated Vital Signs BP (!) 140/99   Pulse 91   Temp 98 F (36.7 C)   Resp 18   Wt 240 lb (108.9 kg)   SpO2 100%   BMI 28.83 kg/m   Visual Acuity Right Eye Distance:   Left Eye Distance:   Bilateral Distance:    Right Eye Near:   Left Eye Near:    Bilateral Near:     Physical Exam  Constitutional: He appears well-developed and well-nourished.  HENT:  Right Ear: External ear normal.  Left Ear: External ear normal.  Mouth/Throat: Oropharynx is clear and moist. No oropharyngeal exudate.  Cardiovascular: Normal rate and regular rhythm.  Pulmonary/Chest: Effort normal and breath sounds normal.     UC Treatments / Results  Labs (all labs ordered are listed, but only abnormal results are displayed) Labs Reviewed -  No data to display  EKG None Radiology No results found.  Procedures Procedures (including critical care time)  Medications Ordered in UC Medications - No data to display   Initial Impression / Assessment and Plan / UC Course  I have reviewed the triage vital signs and the nursing notes.  Pertinent labs & imaging results that were available during my care of the patient were reviewed by me and considered in my medical decision making (see chart for details).     Recurrent seasonal allergies and sinusitis.  Postnasal drainage causing a cough.  I have asked him to stop Allegra use Flonase and Mucinex and I will try him on a course of Zithromax  Final Clinical Impressions(s) / UC Diagnoses   Final diagnoses:  None    ED Discharge Orders    None       Controlled Substance Prescriptions [place] Controlled Substance Registry consulted? No   Wardell Honour, MD 07/25/17 1745

## 2017-07-25 NOTE — ED Triage Notes (Signed)
Pt complains of coughing sneezing and runny nose x 4 days.

## 2017-12-10 ENCOUNTER — Encounter: Payer: Self-pay | Admitting: Family Medicine

## 2017-12-10 ENCOUNTER — Other Ambulatory Visit: Payer: Self-pay

## 2017-12-10 ENCOUNTER — Ambulatory Visit: Payer: 59 | Attending: Family Medicine | Admitting: Family Medicine

## 2017-12-10 VITALS — BP 130/87 | HR 83 | Temp 97.7°F | Resp 18 | Ht 77.0 in | Wt 236.6 lb

## 2017-12-10 DIAGNOSIS — Z8249 Family history of ischemic heart disease and other diseases of the circulatory system: Secondary | ICD-10-CM | POA: Insufficient documentation

## 2017-12-10 DIAGNOSIS — Z23 Encounter for immunization: Secondary | ICD-10-CM | POA: Diagnosis not present

## 2017-12-10 DIAGNOSIS — G8929 Other chronic pain: Secondary | ICD-10-CM

## 2017-12-10 DIAGNOSIS — R202 Paresthesia of skin: Secondary | ICD-10-CM | POA: Diagnosis not present

## 2017-12-10 DIAGNOSIS — Z87891 Personal history of nicotine dependence: Secondary | ICD-10-CM | POA: Insufficient documentation

## 2017-12-10 DIAGNOSIS — M545 Low back pain: Secondary | ICD-10-CM

## 2017-12-10 MED ORDER — NAPROXEN 500 MG PO TABS: ORAL_TABLET | ORAL | 1 refills | Status: DC

## 2017-12-10 NOTE — Progress Notes (Signed)
Patient ID: [name] [name], male    DOB: 10/06/1970  MRN: [id number]   Subjective: [name] [name] is a 47 y.o. male who presents for new patient appointment His concerns today include: Recurrent low back pain, an area of numbness on the bottom of his right foot and overall health.  Patient states that he is getting older and is concerned about his health.  Patient has family history of mother and sister with diabetes.  Patient also states that his mother died at the age of 55 from a heart attack.  Patient states that he currently works in a sedentary job where he is usually sitting down for up to 8 hours/day.  Patient states that when he gets up from sitting for long periods, he feels as if his back is stiff.  Patient states that in the past, nonsteroidal anti-inflammatory medication such as naproxen has helped.  Patient also reports a history of right hip pain but states that this has now resolved.  Patient also in the past has had an area of numbness or area that feels cold on the bottom of his right foot.  Patient states that this went away but has now returned over the past few weeks.  Patient states that in the past when he went to urgent care regarding the numbness in his foot, he was placed on amlodipine 2.5 mg and told that his blood pressure was slightly elevated.      Patient has a history of smoking from about 3 81-2006 he smoked about 1 pack/day of cigarettes but quit smoking altogether in 2006.  Patient is currently divorced.  Patient has had no past surgeries.  Patient would like to receive his influenza immunization at today's visit.  Patient reports he is not currently on any prescription medication.  Patient on review of systems has had no headaches or dizziness.  Patient does have occasional issues with nasal congestion and postnasal drainage/allergic rhinitis.  Patient does admit to snoring.  Patient is not interested in referral for a sleep study.  Patient denies chest pain or  palpitations, no shortness of breath or cough.  Patient denies any abdominal pain.  No blood in the stools.  Patient has noticed that it sometimes takes a little time before he can initiate his urinary stream.  Patient denies any dysuria.  Patient with occasional once per night nocturia.  Patient denies sensation of incomplete bladder emptying.  Patient Active Problem List   Diagnosis Date Noted  . Pain in joint, pelvic region and thigh 08/17/2015     No current outpatient medications on file prior to visit.   No current facility-administered medications on file prior to visit.     No Known Allergies  Social History   Tobacco Use  . Smoking status: Former Smoker    Types: Cigarettes  . Smokeless tobacco: Former Systems developer    Quit date: 2006  Substance Use Topics  . Alcohol use: Yes    Comment: social  . Drug use: Never     Family History  Problem Relation Age of Onset  . Diabetes Mother   . Heart attack Mother   . Diabetes Sister     Past Surgical History:  Procedure Laterality Date  . NO PAST SURGERIES      ROS: Review of Systems  Constitutional: Positive for fatigue. Negative for chills and fever.  HENT: Positive for congestion. Negative for sore throat and trouble swallowing.   Respiratory: Negative for cough and shortness of breath.  Cardiovascular: Negative for chest pain, palpitations and leg swelling.  Gastrointestinal: Negative for abdominal pain and blood in stool.  Endocrine: Negative for polydipsia, polyphagia and polyuria.  Genitourinary: Positive for difficulty urinating (Patient has noticed a slight delay in initiation of urinary stream). Negative for dysuria.  Musculoskeletal: Positive for back pain and joint swelling.  Allergic/Immunologic: Positive for environmental allergies. Negative for food allergies.  Neurological: Negative for dizziness and headaches.  Psychiatric/Behavioral: Negative for sleep disturbance and suicidal ideas.    PHYSICAL  EXAM: BP 130/87 (BP Location: Left Arm, Patient Position: Sitting, Cuff Size: Normal)   Pulse 83   Temp 97.7 F (36.5 C) (Oral)   Resp 18   Ht 6\' 5"  (1.956 m)   Wt 236 lb 9.6 oz (107.3 kg)   SpO2 97%   BMI 28.06 kg/m    Physical Exam  Vitals reviewed.  General-well-nourished, well-developed tall adult male in no acute distress ENT- TMs light pink bilaterally, nares with edematous nasal turbinates, patient with a narrowed posterior airway with evidence of mucoid postnasal drainage and patient with a large tongue base Neck-supple, no lymphadenopathy, no thyromegaly, no carotid bruit Lungs-clear to auscultation bilaterally Cardiovascular-regular rate and rhythm.  Normal dorsalis pedis and posterior tibial pulses bilaterally Abdomen-soft, nontender. Back-no CVA tenderness, no reproducible back pain on exam, patient does have moderate bilateral thoracolumbar paraspinous spasm Extremities-no edema Skin- patient does not have any active skin breakdown on the feet but he does have dry, flaky skin along the arches of the feet bilaterally. Neuro- cranial nerves II through XII are grossly intact.  Patient does have an area on the right medial midfoot that patient states feels cold with palpation in this area.  ASSESSMENT AND PLAN: 1. Chronic midline low back pain, with sciatica presence unspecified Patient reports issues with recurrent, chronic low back pain.  Patient is returning for fasting labs and will obtain PSA at that time as patient also reports some delay in initiation of urinary stream.  Patient will also obtain x-rays of the lumbar spine, order placed.  Prescription provided for naproxen 500 mg to take 1 pill after meal up to twice daily as needed for pain - naproxen (NAPROSYN) 500 MG tablet; Take one pill take per day after a meal as needed for pain  Dispense: 60 tablet; Refill: 1 - PSA; Future - DG Lumbar Spine Complete; Future  2. Paresthesia of right foot Patient with complaint  of paresthesia/numbness/cold sensation of the right foot.  I discussed with the patient that I suspect this may be related to possible radiculopathy from his back pain.  However we will also check CMP, hemoglobin A1c secondary to family history of diabetes and check TSH for possible causes of paresthesias. - Comprehensive metabolic panel; Future - HgB A1c; Future - TSH; Future  3. Family history of early CAD Patient with a family history of his mother passed away at age 14 from heart disease.  Patient will return for fasting lipid panel.  Low-fat diet and regular exercise also encouraged. - Lipid panel; Future  An After Visit Summary was printed and given to the patient. Allergies as of 12/10/2017   No Known Allergies     Medication List        Accurate as of 12/10/17  5:58 PM. Always use your most recent med list.          naproxen 500 MG tablet Commonly known as:  NAPROSYN Take one pill take per day after a meal as needed for pain  Return if symptoms worsen or fail to improve, for fasting labs.  Antony Blackbird, MD, [facility]al[facility] Chessman

## 2017-12-10 NOTE — Patient Instructions (Signed)

## 2017-12-10 NOTE — Progress Notes (Signed)
Patient would like a flu shot today.

## 2017-12-11 ENCOUNTER — Ambulatory Visit (HOSPITAL_COMMUNITY)
Admission: RE | Admit: 2017-12-11 | Discharge: 2017-12-11 | Disposition: A | Discharge status: Home / Self Care | Payer: 59 | Source: Ambulatory Visit | Attending: Family Medicine | Admitting: Family Medicine

## 2017-12-11 DIAGNOSIS — G8929 Other chronic pain: Secondary | ICD-10-CM | POA: Diagnosis not present

## 2017-12-11 DIAGNOSIS — M545 Low back pain: Secondary | ICD-10-CM | POA: Insufficient documentation

## 2017-12-16 ENCOUNTER — Telehealth: Payer: Self-pay

## 2017-12-16 NOTE — Telephone Encounter (Signed)
Patient was called, answered, and verified DOB. Patient was informed of most recent lab results, verbalized understanding and had no further questions.

## 2017-12-16 NOTE — Telephone Encounter (Signed)
-----   Message from Antony Blackbird, MD sent at 12/13/2017  5:48 PM EDT ----- Please notify patient that his lumbar spine xray result was normal

## 2017-12-17 ENCOUNTER — Other Ambulatory Visit: Payer: Self-pay | Admitting: Family Medicine

## 2017-12-17 ENCOUNTER — Ambulatory Visit: Payer: 59 | Attending: Family Medicine

## 2017-12-17 DIAGNOSIS — Z8249 Family history of ischemic heart disease and other diseases of the circulatory system: Secondary | ICD-10-CM | POA: Insufficient documentation

## 2017-12-17 DIAGNOSIS — M545 Low back pain: Secondary | ICD-10-CM | POA: Diagnosis not present

## 2017-12-17 DIAGNOSIS — E785 Hyperlipidemia, unspecified: Secondary | ICD-10-CM | POA: Insufficient documentation

## 2017-12-17 DIAGNOSIS — G8929 Other chronic pain: Secondary | ICD-10-CM | POA: Diagnosis not present

## 2017-12-17 DIAGNOSIS — R202 Paresthesia of skin: Secondary | ICD-10-CM | POA: Diagnosis present

## 2017-12-17 LAB — POCT GLYCOSYLATED HEMOGLOBIN (HGB A1C): Hemoglobin A1C: 5.6 % (ref 4.0–5.6)

## 2017-12-17 MED ORDER — TRIAMCINOLONE 0.1 % CREAM:EUCERIN CREAM 1:1: 1.0000 "application " | TOPICAL_CREAM | Freq: Two times a day (BID) | CUTANEOUS | 11 refills | Status: DC | PRN

## 2017-12-17 NOTE — Progress Notes (Signed)
Patient here today for lab visit and requested a cream for treatment of the dry skin on his feet. RX will be sent to his pharmacy for eucerin:triamcinolone 0.1% to be applied twice per day as needed.

## 2017-12-17 NOTE — Progress Notes (Signed)
Patient here for lab visit only 

## 2017-12-18 LAB — LIPID PANEL
Chol/HDL Ratio: 5.8 ratio — ABNORMAL HIGH (ref 0.0–5.0)
Cholesterol, Total: 221 mg/dL — ABNORMAL HIGH (ref 100–199)
HDL: 38 mg/dL — ABNORMAL LOW
LDL Calculated: 148 mg/dL — ABNORMAL HIGH (ref 0–99)
Triglycerides: 173 mg/dL — ABNORMAL HIGH (ref 0–149)
VLDL Cholesterol Cal: 35 mg/dL (ref 5–40)

## 2017-12-18 LAB — TSH: TSH: 3.79 u[IU]/mL (ref 0.450–4.500)

## 2017-12-18 LAB — COMPREHENSIVE METABOLIC PANEL WITH GFR
ALT: 43 IU/L (ref 0–44)
AST: 33 IU/L (ref 0–40)
Albumin/Globulin Ratio: 1.6 (ref 1.2–2.2)
Albumin: 4.6 g/dL (ref 3.5–5.5)
Alkaline Phosphatase: 36 IU/L — ABNORMAL LOW (ref 39–117)
BUN/Creatinine Ratio: 10 (ref 9–20)
BUN: 10 mg/dL (ref 6–24)
Bilirubin Total: 0.9 mg/dL (ref 0.0–1.2)
CO2: 22 mmol/L (ref 20–29)
Calcium: 10 mg/dL (ref 8.7–10.2)
Chloride: 100 mmol/L (ref 96–106)
Creatinine, Ser: 0.97 mg/dL (ref 0.76–1.27)
GFR calc Af Amer: 106 mL/min/1.73
GFR calc non Af Amer: 91 mL/min/1.73
Globulin, Total: 2.9 g/dL (ref 1.5–4.5)
Glucose: 104 mg/dL — ABNORMAL HIGH (ref 65–99)
Potassium: 4.5 mmol/L (ref 3.5–5.2)
Sodium: 138 mmol/L (ref 134–144)
Total Protein: 7.5 g/dL (ref 6.0–8.5)

## 2017-12-18 LAB — PSA: Prostate Specific Ag, Serum: 1.1 ng/mL (ref 0.0–4.0)

## 2017-12-29 ENCOUNTER — Telehealth: Payer: Self-pay

## 2017-12-29 NOTE — Telephone Encounter (Signed)
Patient was called, verified dob, and was given result, verbalized understanding and had no further questions. Patient asked about a foot lotion that was supposed to be sent to the [facility] on [facility] for dry skin on his feet. Please fu at your earliest convenience.

## 2017-12-29 NOTE — Telephone Encounter (Signed)
Rx was sent on the day of his recent lab visit for eucerin with triamcinolone. Please resend or print and fax as per medication list in chart

## 2017-12-29 NOTE — Telephone Encounter (Signed)
-----   Message from Antony Blackbird, MD sent at 12/18/2017  9:15 AM EDT ----- Please notify patient that his lipid panel showed an elevation in his LDL/bad cholesterol at 148 - needs to follow a low fat diet and exercise on a regular basis. PSA was normal. TSH was normal. CMP was normal except for glucose of 104 which is considered abnormal. At your next visit or at your convenience you may want to come in to have a blood test called a Hgb A1c which tells how your blood sugar has been on average over the past 90 days

## 2017-12-30 ENCOUNTER — Other Ambulatory Visit: Payer: Self-pay

## 2017-12-30 MED ORDER — TRIAMCINOLONE 0.1 % CREAM:EUCERIN CREAM 1:1: 1.0000 "application " | TOPICAL_CREAM | Freq: Two times a day (BID) | CUTANEOUS | 11 refills | Status: AC | PRN

## 2017-12-30 NOTE — Telephone Encounter (Signed)
Pharmacy needs quantity and ratio to fill. Please fu at your earliest convenience.

## 2017-12-30 NOTE — Telephone Encounter (Signed)
Please let pharmacy know that the ratio is one to one and ask if they can make 120 gm size

## 2018-05-04 ENCOUNTER — Telehealth: Payer: Self-pay | Admitting: Family Medicine

## 2018-05-04 NOTE — Telephone Encounter (Signed)
Patient called in to follow up on an issue, patient did not disclose reason. Patient did however express frustration regarding his bill that was sent per his insurance he was told to contact his providers office. Please follow up

## 2018-05-04 NOTE — Telephone Encounter (Signed)
Patient needs to reach out to the billing department regarding bills. Please contact the patient and ask him to contact the number listed on the bill

## 2018-05-04 NOTE — Telephone Encounter (Signed)
Patient was notified of billing department number to call. however, says he has a personal issue that he needed to discuss aside from the billing issue. Patient did not want to disclose what it was regarding. Please follow up.

## 2018-05-05 NOTE — Telephone Encounter (Signed)
Patient verified DOB Patient is upset because his insurance company is attempting to collect on a portion of his august visit because his visit was not solely a physical.  Patient advised again to contact the billing office to address this concern. MD documented according to patients complaints and concerns.

## 2020-06-04 DIAGNOSIS — Z23 Encounter for immunization: Secondary | ICD-10-CM | POA: Insufficient documentation

## 2020-06-06 DIAGNOSIS — E041 Nontoxic single thyroid nodule: Secondary | ICD-10-CM | POA: Insufficient documentation

## 2020-06-06 DIAGNOSIS — R030 Elevated blood-pressure reading, without diagnosis of hypertension: Secondary | ICD-10-CM | POA: Insufficient documentation

## 2020-06-11 DIAGNOSIS — I10 Essential (primary) hypertension: Secondary | ICD-10-CM | POA: Insufficient documentation

## 2020-07-24 ENCOUNTER — Emergency Department (HOSPITAL_COMMUNITY)
Admission: EM | Admit: 2020-07-24 | Discharge: 2020-07-24 | Disposition: A | Discharge status: Home / Self Care | Payer: BC Managed Care – PPO | Attending: Emergency Medicine | Admitting: Emergency Medicine

## 2020-07-24 ENCOUNTER — Other Ambulatory Visit: Payer: Self-pay

## 2020-07-24 ENCOUNTER — Encounter (HOSPITAL_COMMUNITY): Payer: Self-pay

## 2020-07-24 ENCOUNTER — Telehealth: Payer: Self-pay | Admitting: Internal Medicine

## 2020-07-24 ENCOUNTER — Emergency Department (HOSPITAL_COMMUNITY): Payer: BC Managed Care – PPO

## 2020-07-24 DIAGNOSIS — K921 Melena: Secondary | ICD-10-CM | POA: Diagnosis not present

## 2020-07-24 DIAGNOSIS — R1032 Left lower quadrant pain: Secondary | ICD-10-CM | POA: Insufficient documentation

## 2020-07-24 DIAGNOSIS — Z79899 Other long term (current) drug therapy: Secondary | ICD-10-CM | POA: Diagnosis not present

## 2020-07-24 DIAGNOSIS — Z87891 Personal history of nicotine dependence: Secondary | ICD-10-CM | POA: Insufficient documentation

## 2020-07-24 DIAGNOSIS — K529 Noninfective gastroenteritis and colitis, unspecified: Secondary | ICD-10-CM | POA: Insufficient documentation

## 2020-07-24 DIAGNOSIS — I10 Essential (primary) hypertension: Secondary | ICD-10-CM | POA: Diagnosis not present

## 2020-07-24 DIAGNOSIS — R109 Unspecified abdominal pain: Secondary | ICD-10-CM | POA: Diagnosis present

## 2020-07-24 DIAGNOSIS — R3129 Other microscopic hematuria: Secondary | ICD-10-CM | POA: Insufficient documentation

## 2020-07-24 DIAGNOSIS — R195 Other fecal abnormalities: Secondary | ICD-10-CM | POA: Insufficient documentation

## 2020-07-24 HISTORY — DX: Essential (primary) hypertension: I10

## 2020-07-24 HISTORY — DX: Pure hypercholesterolemia, unspecified: E78.00

## 2020-07-24 LAB — COMPREHENSIVE METABOLIC PANEL
ALT: 27 U/L (ref 0–44)
AST: 23 U/L (ref 15–41)
Albumin: 4.3 g/dL (ref 3.5–5.0)
Alkaline Phosphatase: 32 U/L — ABNORMAL LOW (ref 38–126)
Anion gap: 9 (ref 5–15)
BUN: 8 mg/dL (ref 6–20)
CO2: 27 mmol/L (ref 22–32)
Calcium: 9.2 mg/dL (ref 8.9–10.3)
Chloride: 101 mmol/L (ref 98–111)
Creatinine, Ser: 0.9 mg/dL (ref 0.61–1.24)
GFR, Estimated: >60 mL/min (ref >60)
Glucose, Bld: 103 mg/dL — ABNORMAL HIGH (ref 70–99)
Potassium: 3.8 mmol/L (ref 3.5–5.1)
Sodium: 137 mmol/L (ref 135–145)
Total Bilirubin: 1.2 mg/dL (ref 0.3–1.2)
Total Protein: 7.9 g/dL (ref 6.5–8.1)

## 2020-07-24 LAB — URINALYSIS, ROUTINE W REFLEX MICROSCOPIC
Bacteria, UA: NONE SEEN
Bilirubin Urine: NEGATIVE
Glucose, UA: NEGATIVE mg/dL
Ketones, ur: NEGATIVE mg/dL
Leukocytes,Ua: NEGATIVE
Nitrite: NEGATIVE
Protein, ur: 30 mg/dL — AB
Specific Gravity, Urine: 1.015 (ref 1.005–1.030)
pH: 6 (ref 5.0–8.0)

## 2020-07-24 LAB — CBC
HCT: 43.9 % (ref 39.0–52.0)
Hemoglobin: 14.8 g/dL (ref 13.0–17.0)
MCH: 31.4 pg (ref 26.0–34.0)
MCHC: 33.7 g/dL (ref 30.0–36.0)
MCV: 93.2 fL (ref 80.0–100.0)
Platelets: 241 10*3/uL (ref 150–400)
RBC: 4.71 MIL/uL (ref 4.22–5.81)
RDW: 13.3 % (ref 11.5–15.5)
WBC: 7.6 10*3/uL (ref 4.0–10.5)
nRBC: 0 % (ref 0.0–0.2)

## 2020-07-24 LAB — PROTIME-INR
INR: 1 (ref 0.8–1.2)
Prothrombin Time: 13.3 seconds (ref 11.4–15.2)

## 2020-07-24 LAB — POC OCCULT BLOOD, ED: Fecal Occult Bld: POSITIVE — AB

## 2020-07-24 LAB — TYPE AND SCREEN
ABO/RH(D): O POS
Antibody Screen: NEGATIVE

## 2020-07-24 LAB — APTT: aPTT: 33 seconds (ref 24–36)

## 2020-07-24 MED ORDER — IOHEXOL 300 MG/ML  SOLN
100.0000 mL | Freq: Once | INTRAMUSCULAR | Status: AC | PRN
  Administered 2020-07-24: 100 mL via INTRAVENOUS

## 2020-07-24 MED ORDER — PANTOPRAZOLE SODIUM 40 MG IV SOLR
40.0000 mg | Freq: Once | INTRAVENOUS | Status: AC
  Administered 2020-07-24: 40 mg via INTRAVENOUS
  Filled 2020-07-24: qty 40

## 2020-07-24 NOTE — ED Triage Notes (Signed)
Patient states he was having constipation and abdominal pain 3 days ago and when he did go, he had diarrhea with bright red blood. Patient had a BM today and had bright red blood on the stool.

## 2020-07-24 NOTE — ED Provider Notes (Signed)
[facility] DEPT Provider Note   CSN: [id number] Arrival date & time: 07/24/20  1707     History Chief Complaint  Patient presents with  . Blood In Stools  . Abdominal Pain    [name] [name] is a 50 y.o. male.  HPI Patient is a 50 year old male with a history of hypertension as well as hyperlipidemia who presents the emergency department due to hematochezia.  Patient states that about 3 days ago he began experiencing diaphoresis, nausea, one episode of vomiting.  He states that he then began having left lower quadrant abdominal pain and had an episode of diarrhea that night and noticed blood when wiping.  Patient states he had no additional bowel movements the next day.  No continued nausea or vomiting.  He was evaluated in urgent care yesterday and states that he had a normal rectal exam and was discharged on Augmentin.  He has taken 2 doses of Augmentin.  Patient states about 1 hour prior to arrival he began having left lower quadrant pain again and had an episode of hematochezia.  He showed me photographs of bedside of a well formed stool covered in bright red blood with additional blood clots.  No history of similar symptoms.  Patient does not regularly take NSAIDs.  He is not a regular drinker.  No fevers, chills, chest pain, shortness of breath, urinary complaints.  He is not anticoagulated.    Past Medical History:  Diagnosis Date  . Allergic rhinitis   . High cholesterol   . Hypertension   . Low back pain     Patient Active Problem List   Diagnosis Date Noted  . Pain in joint, pelvic region and thigh 08/17/2015    Past Surgical History:  Procedure Laterality Date  . NO PAST SURGERIES         Family History  Problem Relation Age of Onset  . Diabetes Mother   . Heart attack Mother   . Diabetes Sister     Social History   Tobacco Use  . Smoking status: Former Smoker    Types: Cigarettes  . Smokeless tobacco: Former Systems developer    Quit  date: 2006  Vaping Use  . Vaping Use: Never used  Substance Use Topics  . Alcohol use: Yes    Comment: social  . Drug use: Never    Home Medications Prior to Admission medications   Medication Sig Start Date End Date Taking? Authorizing Provider  Acetaminophen (TYLENOL 8 HOUR ARTHRITIS PAIN PO) Take 1 tablet by mouth every 8 (eight) hours as needed (pain).   Yes [provider]  amoxicillin-clavulanate (AUGMENTIN) 875-125 MG tablet SMARTSIG:1 Tablet(s) By Mouth Every 12 Hours 07/23/20  Yes [provider]  atorvastatin (LIPITOR) 10 MG tablet Take 1 tablet by mouth daily. 06/11/20  Yes [provider]  lisinopril (ZESTRIL) 20 MG tablet Take 1 tablet by mouth daily. 06/22/20  Yes [provider]  naproxen (NAPROSYN) 500 MG tablet Take one pill take per day after a meal as needed for pain Patient not taking: No sig reported 12/10/17   Antony Blackbird, MD    Allergies    Patient has no known allergies.  Review of Systems   Review of Systems  All other systems reviewed and are negative. Ten systems reviewed and are negative for acute change, except as noted in the HPI.   Physical Exam Updated Vital Signs BP 124/86 (BP Location: Right Arm)   Pulse 75   Temp 98  F (36.7 C) (Oral)   Resp 16   Ht 6\' 4"  (1.93 m)   Wt 99.3 kg   SpO2 98%   BMI 26.66 kg/m   Physical Exam Vitals and nursing note reviewed.  Constitutional:      General: He is not in acute distress.    Appearance: Normal appearance. He is well-developed and normal weight. He is not ill-appearing, toxic-appearing or diaphoretic.  HENT:     Head: Normocephalic and atraumatic.     Right Ear: External ear normal.     Left Ear: External ear normal.     Nose: Nose normal.     Mouth/Throat:     Mouth: Mucous membranes are moist.     Pharynx: Oropharynx is clear. No oropharyngeal exudate or posterior oropharyngeal erythema.  Eyes:     Extraocular Movements: Extraocular movements intact.   Cardiovascular:     Rate and Rhythm: Normal rate and regular rhythm.     Pulses: Normal pulses.     Heart sounds: Normal heart sounds. No murmur heard. No friction rub. No gallop.   Pulmonary:     Effort: Pulmonary effort is normal. No respiratory distress.     Breath sounds: Normal breath sounds. No stridor. No wheezing, rhonchi or rales.  Abdominal:     General: Abdomen is flat.     Tenderness: There is abdominal tenderness in the periumbilical area and left lower quadrant. Negative signs include Murphy's sign and McBurney's sign.  Genitourinary:    Comments: Nursing chaperone present.  Small nonthrombosed external hemorrhoid noted.  No tenderness.  No palpable internal hemorrhoids noted.  Brown stool noted in the rectal vault with frank hematochezia.  No tenderness appreciated throughout the exam. Musculoskeletal:        General: Normal range of motion.     Cervical back: Normal range of motion and neck supple. No tenderness.  Skin:    General: Skin is warm and dry.  Neurological:     General: No focal deficit present.     Mental Status: He is alert and oriented to person, place, and time.  Psychiatric:        Mood and Affect: Mood normal.        Behavior: Behavior normal.    ED Results / Procedures / Treatments   Labs (all labs ordered are listed, but only abnormal results are displayed) Labs Reviewed  COMPREHENSIVE METABOLIC PANEL - Abnormal; Notable for the following components:      Result Value   Glucose, Bld 103 (*)    Alkaline Phosphatase 32 (*)    All other components within normal limits  URINALYSIS, ROUTINE W REFLEX MICROSCOPIC - Abnormal; Notable for the following components:   Hgb urine dipstick LARGE (*)    Protein, ur 30 (*)    All other components within normal limits  POC OCCULT BLOOD, ED - Abnormal; Notable for the following components:   Fecal Occult Bld POSITIVE (*)    All other components within normal limits  CBC  PROTIME-INR  APTT  TYPE AND  SCREEN  ABO/RH   EKG None  Radiology CT ABDOMEN PELVIS W CONTRAST  Result Date: 07/24/2020 CLINICAL DATA:  Left lower quadrant abdominal pain, periumbilical pain, hematochezia EXAM: CT ABDOMEN AND PELVIS WITH CONTRAST TECHNIQUE: Multidetector CT imaging of the abdomen and pelvis was performed using the standard protocol following bolus administration of intravenous contrast. CONTRAST:  170mL OMNIPAQUE IOHEXOL 300 MG/ML  SOLN COMPARISON:  None. FINDINGS: Lower chest: No acute pleural or parenchymal lung  disease. Hepatobiliary: No focal liver abnormality is seen. No gallstones, gallbladder wall thickening, or biliary dilatation. Pancreas: Unremarkable. No pancreatic ductal dilatation or surrounding inflammatory changes. Spleen: Normal in size without focal abnormality. Adrenals/Urinary Tract: Adrenal glands are unremarkable. Kidneys are normal, without renal calculi, focal lesion, or hydronephrosis. Bladder is unremarkable. Stomach/Bowel: No bowel obstruction or ileus. There is segmental wall thickening of the distal transverse colon, splenic flexure, and proximal descending colon compatible with inflammatory or infectious colitis. Normal appendix right lower quadrant. Vascular/Lymphatic: Minimal atherosclerosis of the aorta. Incidental circumaortic left renal vein, a frequent anatomic variant. No pathologic adenopathy. Reproductive: Prostate is unremarkable. Other: Trace free fluid within the right upper quadrant and lower pelvis. No free intraperitoneal gas. No abdominal wall hernia. Musculoskeletal: No acute or destructive bony lesions. 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 Heterogeneous sclerosis of the femoral heads may reflect underlying avascular necrosis. Reconstructed images demonstrate no additional findings. IMPRESSION: 1. Segmental wall thickening of the distal transverse and proximal descending colon, compatible with inflammatory or infectious colitis. 2. Trace  free fluid, likely reactive. 3. 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 4.  Aortic Atherosclerosis (ICD10-I70.0). Electronically Signed   By: Randa Ngo M.D.   On: 07/24/2020 19:25    Procedures Procedures   Medications Ordered in ED Medications  iohexol (OMNIPAQUE) 300 MG/ML solution 100 mL (100 mLs Intravenous Contrast Given 07/24/20 1856)  pantoprazole (PROTONIX) injection 40 mg (40 mg Intravenous Given 07/24/20 2008)    ED Course  I have reviewed the triage vital signs and the nursing notes.  Pertinent labs & imaging results that were available during my care of the patient were reviewed by me and considered in my medical decision making (see chart for details).  Clinical Course as of 07/24/20 2022  Tue Jul 24, 2020  1747 Fecal Occult Blood, POC(!): POSITIVE [LJ]  1934 CT ABDOMEN PELVIS W CONTRAST IMPRESSION: 1. Segmental wall thickening of the distal transverse and proximal descending colon, compatible with inflammatory or infectious colitis. 2. Trace free fluid, likely reactive. 3. 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 4. Aortic Atherosclerosis (ICD10-I70.0). 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 Typically gets better on its own. Will need reevaluation and colonoscopy. Stay well hydrated. Bland diet. Advance as tolerated. Call [facility] tomorrow morning. Dr. Henrene Pastor.  [LJ]  2008 I spoke to Dr. Henrene Pastor with [facility] gastroenterology.  Feels that based on patient's presentation as well as imaging and lab work today that this is possibly ischemic colitis.  States that he can be reevaluated outpatient.  Recommend that he stay well-hydrated.  Bland diet and advance as tolerated.  Recommends that he call their office first thing tomorrow morning.  He states he will shoot a message to the Network engineer.  [LJ]    Clinical Course User Index [LJ] Rayna Sexton, PA-C   MDM Rules/Calculators/A&P                           Pt is a 50 y.o. male who presents the emergency department with what appears to be colitis.  Labs: CBC without abnormalities. CMP with a glucose of 103. PT/INR within normal limits. APTT within normal limits. Guaiac positive.  Imaging: CT scan of the abdomen and pelvis shows segmental wall thickening of the distal transverse and proximal descending colon compatible with inflammatory infectious colitis.  There is trace free fluid which is likely reactive.  I, Rayna Sexton, PA-C,  personally reviewed and evaluated these images and lab results as part of my medical decision-making.  Patient discussed with Dr. Henrene Pastor with gastroenterology.  Feels that patient's presentation is likely secondary to ischemic colitis.  Feels that patient can follow-up outpatient.  Recommends that he call their office tomorrow morning.  Recommends bland diet and making sure patient stays well-hydrated.  This was discussed with the patient in length.  We discussed return precautions.  Patient has no leukocytosis.  Hemoglobin is within normal limits.  Afebrile.  Not tachycardic.  Feel that he is stable for discharge and he is agreeable.  He is going to call gastroenterology tomorrow morning.  His questions were answered and he was amicable at the time of discharge.  Note: Portions of this report may have been transcribed using voice recognition software. Every effort was made to ensure accuracy; however, inadvertent computerized transcription errors may be present.   Final Clinical Impression(s) / ED Diagnoses Final diagnoses:  Blood in stool  Colitis   Rx / DC Orders ED Discharge Orders    None       Rayna Sexton, PA-C 07/24/20 2023    Arnaldo Natal, MD 07/24/20 2329

## 2020-07-24 NOTE — Discharge Instructions (Addendum)
I have given you the contact information below for [facility] gastroenterology.  Please give them a call tomorrow morning after 815 to schedule a follow-up appointment.  I would recommend you stay well-hydrated.  Please drink plenty of water.  Please try to eat a bland diet and introduce new foods slowly.  If you develop any new or worsening symptoms, please feel free to return to the emergency department for reevaluation.  It was a pleasure to meet you.

## 2020-07-24 NOTE — Telephone Encounter (Signed)
GI ON CALL NOTE  Patient possibly remotely seen by Dr. Ardis Hughs. Now in [facility] ER with abdominal pain and rectal bleeding. Per ER PA, clinically quite stable without severe pain, brown stool with blood, normal H/H and WBC. CT shows left sided segmental colitis. Suspect ischemia vs infectious. GI outpatient follow up requested. Told to call office in am after 8 am.  [name] [name]. [name] [name]., M.D. [facility] Division of Gastroenterology

## 2020-07-24 NOTE — ED Notes (Signed)
Medications given. Provider in room, speaking with patient.

## 2020-07-25 NOTE — Telephone Encounter (Signed)
Patty, Please offer him my first available OV, OK to book with extender if that is several weeks out.  Thanks

## 2020-07-25 NOTE — Telephone Encounter (Signed)
The pt has an appt with [name] on 5/2 that was previously set up.  Pt aware

## 2020-08-06 DIAGNOSIS — R102 Pelvic and perineal pain: Secondary | ICD-10-CM | POA: Insufficient documentation

## 2020-08-06 DIAGNOSIS — M169 Osteoarthritis of hip, unspecified: Secondary | ICD-10-CM | POA: Insufficient documentation

## 2020-08-06 DIAGNOSIS — M8700 Idiopathic aseptic necrosis of unspecified bone: Secondary | ICD-10-CM | POA: Insufficient documentation

## 2020-08-06 DIAGNOSIS — K529 Noninfective gastroenteritis and colitis, unspecified: Secondary | ICD-10-CM | POA: Insufficient documentation

## 2020-08-06 DIAGNOSIS — Z9189 Other specified personal risk factors, not elsewhere classified: Secondary | ICD-10-CM | POA: Insufficient documentation

## 2020-08-06 DIAGNOSIS — I700 Atherosclerosis of aorta: Secondary | ICD-10-CM | POA: Insufficient documentation

## 2020-08-06 DIAGNOSIS — J309 Allergic rhinitis, unspecified: Secondary | ICD-10-CM | POA: Insufficient documentation

## 2020-08-06 DIAGNOSIS — R739 Hyperglycemia, unspecified: Secondary | ICD-10-CM | POA: Insufficient documentation

## 2020-08-12 ENCOUNTER — Telehealth: Payer: Self-pay | Admitting: Physician Assistant

## 2020-08-12 NOTE — Telephone Encounter (Signed)
Telephone call 11:17 AM  Patient called on-call service and tells me he is scheduled for colonoscopy tomorrow at 11:00 for rectal bleeding and never received information in regards to bowel prep.  When looking in our system he is just scheduled for an office visit to discuss the blood in his stools.  Explained this to him, he became angry telling me that he does not need to come into the office to talk about this because it was scheduled by his PCP.  Explained that our process is that if he is having symptoms then he needs to be seen to ensure that he truly needs a colonoscopy.  Told me he was going to go back home and look at his paperwork and call back.  Ellouise Newer, PA-C

## 2020-08-13 ENCOUNTER — Ambulatory Visit: Payer: BC Managed Care – PPO | Admitting: Nurse Practitioner

## 2020-08-13 ENCOUNTER — Other Ambulatory Visit: Payer: Self-pay

## 2020-08-13 ENCOUNTER — Encounter: Payer: Self-pay | Admitting: Nurse Practitioner

## 2020-08-13 VITALS — BP 130/90 | HR 76 | Ht 75.5 in | Wt 212.1 lb

## 2020-08-13 DIAGNOSIS — R9389 Abnormal findings on diagnostic imaging of other specified body structures: Secondary | ICD-10-CM | POA: Diagnosis not present

## 2020-08-13 DIAGNOSIS — K625 Hemorrhage of anus and rectum: Secondary | ICD-10-CM | POA: Diagnosis not present

## 2020-08-13 MED ORDER — PLENVU 140 G PO SOLR: ORAL | 0 refills | Status: DC

## 2020-08-13 NOTE — Patient Instructions (Signed)
If you are age 52 or older, your body mass index should be between 23-30. Your Body mass index is 26.16 kg/m. If this is out of the aforementioned range listed, please consider follow up with your Primary Care Provider.  If you are age 28 or younger, your body mass index should be between 19-25. Your Body mass index is 26.16 kg/m. If this is out of the aformentioned range listed, please consider follow up with your Primary Care Provider.   You have been scheduled for a colonoscopy. Please follow written instructions given to you at your visit today.  Please pick up your prep supplies at the pharmacy within the next 1-3 days. If you use inhalers (even only as needed), please bring them with you on the day of your procedure.  Due to recent changes in healthcare laws, you may see the results of your imaging and laboratory studies on MyChart before your provider has had a chance to review them.  We understand that in some cases there may be results that are confusing or concerning to you. Not all laboratory results come back in the same time frame and the provider may be waiting for multiple results in order to interpret others.  Please give [place] 48 hours in order for your provider to thoroughly review all the results before contacting the office for clarification of your results.   It was a pleasure to see you today!  [name] , NP

## 2020-08-13 NOTE — Telephone Encounter (Signed)
Yes, I saw him today and scheduled a colonoscopy

## 2020-08-13 NOTE — Progress Notes (Signed)
ASSESSMENT AND PLAN    # 50 yo male with episode of 3 days of LLQ pain, loose stool (x1 episode) and hematochezia in early April. CT scan in ED on 07/24/20 demonstrating segmental wall thickening of the distal transverse and proximal descending colon. Suspect he had infectious colitis, now resolved --Patient will be scheduled for colonoscopy with Dr. Havery Moros for further evaluation of CT scan findings as well as screening colonoscopy. The risks and benefits of colonoscopy with possible polypectomy / biopsies were discussed and the patient agrees to proceed.    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 See additional PMH below.   Patient is new to the practice,  referred by PCP for rectal bleeding. On 07/22/19 patient ate a salad with sardines. He then took his took routine HTN and cholesterol medications and went to sleep. Around 1 am he woke up with sharp LLQ and urge to defecation. Though he had to strain to have a BM he eventually had a loose BM with contained blood.  Over the following two days he continued to have sharp LLQ but no further BMs. He went to see his  On 4/11. Stool was FOBT +. He was prescribed Amoxiciliin for possible gastroenteritis. He was on the antibiotics for only one day when he had another episode of rectal bleeding. He went to ED on 4/12. In ED his CMP, CBC were normal. He was FOBT +. CT scan w/. contrast showed segmental wall thickening of the distal transverse and proximal colon. Patient had no further abdominal pain or rectal bleeding since leaving the ED. He feels fine now. Of note he didn't complete Amoxicilin.   Other than the above symptoms patient has no GI complaints.  Prior to now he has not seen blood in his stool.  No known family history of colon cancer.    Past Medical  History:  Diagnosis Date  . Allergic rhinitis   . Arthritis   . High cholesterol   . Hypertension   . Low back pain      Past Surgical History:  Procedure Laterality Date  . NO PAST SURGERIES     Family History  Problem Relation Age of Onset  . Diabetes Mother   . Heart attack Mother   . Arthritis Mother   . Diabetes Sister    Social History   Tobacco Use  . Smoking status: Former Smoker    Types: Cigarettes    Quit date: 2006    Years since quitting: 16.3  . Smokeless tobacco: Former Systems developer    Quit date: 2006  Vaping Use  . Vaping Use: Never used  Substance Use Topics  . Alcohol use: Yes    Comment: social  . Drug use: Never   Current Outpatient Medications  Medication Sig Dispense Refill  . celecoxib (CELEBREX) 200 MG capsule Take 1 capsule by mouth 2 (two) times daily as needed.     No current facility-administered medications for this visit.   No Known Allergies   Review of Systems: Positive for chronic right knee and right hip pain.   All other systems reviewed and negative except where noted in HPI.    PHYSICAL EXAM :    Wt Readings from Last 3 Encounters:  08/13/20 212 lb 2 oz (96.2 kg)  07/24/20  219 lb (99.3 kg)  12/10/17 236 lb 9.6 oz (107.3 kg)    BP 130/90 (BP Location: Left Arm, Patient Position: Sitting, Cuff Size: Normal)   Pulse 76   Ht 6' 3.5" (1.918 m) Comment: height measured without shoes  Wt 212 lb 2 oz (96.2 kg)   BMI 26.16 kg/m  Constitutional:  Pleasant male in no acute distress. Psychiatric: Normal mood and affect. Behavior is normal. EENT: Pupils normal.  Conjunctivae are normal. No scleral icterus. Neck supple.  Cardiovascular: Normal rate, regular rhythm. No edema Pulmonary/chest: Effort normal and breath sounds normal. No wheezing, rales or rhonchi. Abdominal: Soft, nondistended, nontender. Bowel sounds active throughout. There are no masses palpable. No hepatomegaly. Neurological: Alert and oriented to person place and  time. Skin: Skin is warm and dry. No rashes noted.  [name], NP  08/13/2020, 11:19 AM  Cc:  Referring Provider Stana Bunting, P.A.  [place]

## 2020-08-14 NOTE — Progress Notes (Signed)
Agree with assessment and plan as outlined.  

## 2020-08-15 DIAGNOSIS — K625 Hemorrhage of anus and rectum: Secondary | ICD-10-CM | POA: Insufficient documentation

## 2020-08-15 DIAGNOSIS — M1990 Unspecified osteoarthritis, unspecified site: Secondary | ICD-10-CM | POA: Insufficient documentation

## 2020-09-03 ENCOUNTER — Encounter: Payer: Self-pay | Admitting: Gastroenterology

## 2020-09-03 ENCOUNTER — Other Ambulatory Visit: Payer: Self-pay

## 2020-09-03 ENCOUNTER — Ambulatory Visit: Payer: BC Managed Care – PPO | Admitting: Endocrinology

## 2020-09-03 ENCOUNTER — Encounter: Payer: Self-pay | Admitting: Endocrinology

## 2020-09-03 DIAGNOSIS — E042 Nontoxic multinodular goiter: Secondary | ICD-10-CM | POA: Diagnosis not present

## 2020-09-03 NOTE — Patient Instructions (Signed)
Let's check the ultrasound.  you will receive a phone call, about a day and time for an appointment.   If as expected, no cancer is found, please come back for a follow-up appointment in 8 months.

## 2020-09-03 NOTE — Progress Notes (Signed)
Subjective:    Patient ID: [name] [name], male    DOB: 1971/02/14, 50 y.o.   MRN: [id number]  HPI Pt is referred by Stana Bunting, PA, for nodular thyroid.  Pt was noted to have a thyroid nodule in 2022.  He is unaware of ever having had thyroid problems in the past.  He has no h/o XRT or surgery to the neck.    Past Medical History:  Diagnosis Date  . Allergic rhinitis   . Arthritis   . High cholesterol   . Hypertension   . Low back pain     Past Surgical History:  Procedure Laterality Date  . NO PAST SURGERIES      Social History   Socioeconomic History  . Marital status: Single    Spouse name: Not on file  . Number of children: 0  . Years of education: Not on file  . Highest education level: Not on file  Occupational History  . Occupation: Truck Geophysicist/field seismologist  Tobacco Use  . Smoking status: Former Smoker    Types: Cigarettes    Quit date: 2006    Years since quitting: 16.4  . Smokeless tobacco: Former Systems developer    Quit date: 2006  Vaping Use  . Vaping Use: Never used  Substance and Sexual Activity  . Alcohol use: Yes    Comment: social  . Drug use: Never  . Sexual activity: Not on file  Other Topics Concern  . Not on file  Social History Narrative  . Not on file   Social Determinants of Health   Financial Resource Strain: Not on file  Food Insecurity: Not on file  Transportation Needs: Not on file  Physical Activity: Not on file  Stress: Not on file  Social Connections: Not on file  Intimate Partner Violence: Not on file    Current Outpatient Medications on File Prior to Visit  Medication Sig Dispense Refill  . celecoxib (CELEBREX) 200 MG capsule Take 1 capsule by mouth 2 (two) times daily as needed.    Marland Kitchen PEG-KCl-NaCl-NaSulf-Na Asc-C (PLENVU) 140 g SOLR Use as directed 1 each 0   No current facility-administered medications on file prior to visit.    No Known Allergies  Family History  Problem Relation Age of Onset  . Diabetes Mother   . Heart  attack Mother   . Arthritis Mother   . Diabetes Sister   . Thyroid disease Neg Hx     BP 136/84 (BP Location: Right Arm, Patient Position: Sitting, Cuff Size: Large)   Pulse 94   Ht 6' 3.5" (1.918 m)   Wt 215 lb 9.6 oz (97.8 kg)   SpO2 96%   BMI 26.59 kg/m    Review of Systems Denies hoarseness, neck pain, and sob.      Objective:   Physical Exam VITAL SIGNS:  See vs page GENERAL: no distress.  NECK: I am uncertain if I can palpate the nodule #1  outside test results are reviewed: TSH=2.1  [place]: MNG with Size: 5.6 x 2.7 x 3.3 cm (long x AP x trans).  Isthmus multilobulated.   I have reviewed outside records, and summarized: Pt was noted to have MNG, and referred here.  He was seen routine physical and goiter was noted on exam.      Assessment & Plan:  MNG, new to me, uncertain etiology and prognosis.   Patient Instructions  Let's check the ultrasound.  you will receive a phone call, about a day and time  for an appointment.   If as expected, no cancer is found, please come back for a follow-up appointment in 8 months.

## 2020-09-06 ENCOUNTER — Encounter: Payer: Self-pay | Admitting: Certified Registered Nurse Anesthetist

## 2020-09-07 ENCOUNTER — Other Ambulatory Visit: Payer: Self-pay | Admitting: Endocrinology

## 2020-09-07 ENCOUNTER — Encounter: Payer: Self-pay | Admitting: Gastroenterology

## 2020-09-07 ENCOUNTER — Other Ambulatory Visit: Payer: Self-pay

## 2020-09-07 ENCOUNTER — Ambulatory Visit (AMBULATORY_SURGERY_CENTER): Payer: BC Managed Care – PPO | Admitting: Gastroenterology

## 2020-09-07 ENCOUNTER — Ambulatory Visit
Admission: RE | Admit: 2020-09-07 | Discharge: 2020-09-07 | Disposition: A | Discharge status: Home / Self Care | Payer: Self-pay | Source: Ambulatory Visit | Attending: Endocrinology | Admitting: Endocrinology

## 2020-09-07 VITALS — BP 118/89 | HR 85 | Temp 97.3°F | Resp 16 | Ht 75.5 in | Wt 212.0 lb

## 2020-09-07 DIAGNOSIS — K5730 Diverticulosis of large intestine without perforation or abscess without bleeding: Secondary | ICD-10-CM

## 2020-09-07 DIAGNOSIS — E042 Nontoxic multinodular goiter: Secondary | ICD-10-CM

## 2020-09-07 DIAGNOSIS — R9389 Abnormal findings on diagnostic imaging of other specified body structures: Secondary | ICD-10-CM | POA: Diagnosis not present

## 2020-09-07 DIAGNOSIS — K648 Other hemorrhoids: Secondary | ICD-10-CM

## 2020-09-07 DIAGNOSIS — K625 Hemorrhage of anus and rectum: Secondary | ICD-10-CM

## 2020-09-07 DIAGNOSIS — D128 Benign neoplasm of rectum: Secondary | ICD-10-CM

## 2020-09-07 DIAGNOSIS — D127 Benign neoplasm of rectosigmoid junction: Secondary | ICD-10-CM | POA: Diagnosis not present

## 2020-09-07 MED ORDER — SODIUM CHLORIDE 0.9 % IV SOLN: 500.0000 mL | Freq: Once | INTRAVENOUS | Status: DC

## 2020-09-07 NOTE — Patient Instructions (Signed)
Await pathology  Please read over handouts about polyps, hemorrhoids and diverticulosis  Continue your normal medications  YOU HAD AN ENDOSCOPIC PROCEDURE TODAY AT [facility]:   Refer to the procedure report that was given to you for any specific questions about what was found during the examination.  If the procedure report does not answer your questions, please call your gastroenterologist to clarify.  If you requested that your care partner not be given the details of your procedure findings, then the procedure report has been included in a sealed envelope for you to review at your convenience later.  YOU SHOULD EXPECT: Some feelings of bloating in the abdomen. Passage of more gas than usual.  Walking can help get rid of the air that was put into your GI tract during the procedure and reduce the bloating. If you had a lower endoscopy (such as a colonoscopy or flexible sigmoidoscopy) you may notice spotting of blood in your stool or on the toilet paper. If you underwent a bowel prep for your procedure, you may not have a normal bowel movement for a few days.  Please Note:  You might notice some irritation and congestion in your nose or some drainage.  This is from the oxygen used during your procedure.  There is no need for concern and it should clear up in a day or so.  SYMPTOMS TO REPORT IMMEDIATELY:   Following lower endoscopy (colonoscopy or flexible sigmoidoscopy):  Excessive amounts of blood in the stool  Significant tenderness or worsening of abdominal pains  Swelling of the abdomen that is new, acute  Fever of 100F or higher  For urgent or emergent issues, a gastroenterologist can be reached at any hour by calling [phone]. Do not use MyChart messaging for urgent concerns.    DIET:  We do recommend a small meal at first, but then you may proceed to your regular diet.  Drink plenty of fluids but you should avoid alcoholic beverages for 24 hours.  ACTIVITY:   You should plan to take it easy for the rest of today and you should NOT DRIVE or use heavy machinery until tomorrow (because of the sedation medicines used during the test).    FOLLOW UP: Our staff will call the number listed on your records 48-72 hours following your procedure to check on you and address any questions or concerns that you may have regarding the information given to you following your procedure. If we do not reach you, we will leave a message.  We will attempt to reach you two times.  During this call, we will ask if you have developed any symptoms of COVID 19. If you develop any symptoms (ie: fever, flu-like symptoms, shortness of breath, cough etc.) before then, please call [phone].  If you test positive for Covid 19 in the 2 weeks post procedure, please call and report this information to [place].    If any biopsies were taken you will be contacted by phone or by letter within the next 1-3 weeks.  Please call us at [phone] if you have not heard about the biopsies in 3 weeks.    SIGNATURES/CONFIDENTIALITY: You and/or your care partner have signed paperwork which will be entered into your electronic medical record.  These signatures attest to the fact that that the information above on your After Visit Summary has been reviewed and is understood.  Full responsibility of the confidentiality of this discharge information lies with you and/or your care-partner.

## 2020-09-07 NOTE — Progress Notes (Signed)
Called to room to assist during endoscopic procedure.  Patient ID and intended procedure confirmed with present staff. Received instructions for my participation in the procedure from the performing physician.  

## 2020-09-07 NOTE — Op Note (Signed)
[facility] Patient Name: [name] [name] Procedure Date: 09/07/2020 1:55 PM MRN: [id number] Endoscopist: Remo Lipps P. Havery Moros , MD Age: 50 Referring MD:  Date of Birth: [birth year] Gender: Male Account #: [id number] Procedure:                Colonoscopy Indications:              Abnormal CT of the GI tract - thickening of                            transverse / left colon Medicines:                Monitored Anesthesia Care Procedure:                Pre-Anesthesia Assessment:                           - Prior to the procedure, a History and Physical                            was performed, and patient medications and                            allergies were reviewed. The patient's tolerance of                            previous anesthesia was also reviewed. The risks                            and benefits of the procedure and the sedation                            options and risks were discussed with the patient.                            All questions were answered, and informed consent                            was obtained. Prior Anticoagulants: The patient has                            taken no previous anticoagulant or antiplatelet                            agents. ASA Grade Assessment: II - A patient with                            mild systemic disease. After reviewing the risks                            and benefits, the patient was deemed in                            satisfactory condition to undergo the procedure.  After obtaining informed consent, the colonoscope                            was passed under direct vision. Throughout the                            procedure, the patient's blood pressure, pulse, and                            oxygen saturations were monitored continuously. The                            Olympus CF-HQ190 [id number]) Colonoscope was                            introduced through the anus and advanced to  the the                            terminal ileum, with identification of the                            appendiceal orifice and IC valve. The colonoscopy                            was performed without difficulty. The patient                            tolerated the procedure well. The quality of the                            bowel preparation was good. The terminal ileum,                            ileocecal valve, appendiceal orifice, and rectum                            were photographed. Scope In: 2:01:03 PM Scope Out: 2:18:20 PM Scope Withdrawal Time: 0 hours 13 minutes 28 seconds  Total Procedure Duration: 0 hours 17 minutes 17 seconds  Findings:                 The perianal and digital rectal examinations were                            normal.                           The terminal ileum appeared normal.                           Multiple small-mouthed diverticula were found in                            the sigmoid colon.  A 3 mm polyp was found in the recto-sigmoid colon.                            The polyp was sessile. The polyp was removed with a                            cold snare. Resection and retrieval were complete.                           Three sessile polyps were found in the rectum. The                            polyps were 2 to 3 mm in size. These polyps were                            removed with a cold snare. Resection and retrieval                            were complete.                           Internal hemorrhoids were found during retroflexion.                           The exam was otherwise without abnormality. Complications:            No immediate complications. Estimated blood loss:                            Minimal. Estimated Blood Loss:     Estimated blood loss was minimal. Impression:               - The examined portion of the ileum was normal.                           - Diverticulosis in the sigmoid colon.                            - One 3 mm polyp at the recto-sigmoid colon,                            removed with a cold snare. Resected and retrieved.                           - Three 2 to 3 mm polyps in the rectum, removed                            with a cold snare. Resected and retrieved.                           - Internal hemorrhoids.                           - The examination was otherwise normal.  Suspect prior CT findings represent infectious /                            self limited colitis which has since resolved.                            Patient asymptomatic currently. Recommendation:           - Patient has a contact number available for                            emergencies. The signs and symptoms of potential                            delayed complications were discussed with the                            patient. Return to normal activities tomorrow.                            Written discharge instructions were provided to the                            patient.                           - Resume previous diet.                           - Continue present medications.                           - Await pathology results. Remo Lipps P. Khadim Lundberg, MD 09/07/2020 2:24:07 PM This report has been signed electronically.

## 2020-09-07 NOTE — Progress Notes (Signed)
Report given to PACU, vss,

## 2020-09-07 NOTE — Progress Notes (Signed)
[facility]  Pt's states no medical or surgical changes since previsit or office visit.  

## 2020-09-11 ENCOUNTER — Telehealth: Payer: Self-pay

## 2020-09-11 NOTE — Telephone Encounter (Signed)
Left message on answering machine. 

## 2020-09-13 DIAGNOSIS — K5730 Diverticulosis of large intestine without perforation or abscess without bleeding: Secondary | ICD-10-CM | POA: Insufficient documentation

## 2020-09-13 DIAGNOSIS — K635 Polyp of colon: Secondary | ICD-10-CM | POA: Insufficient documentation

## 2020-09-13 DIAGNOSIS — K648 Other hemorrhoids: Secondary | ICD-10-CM | POA: Insufficient documentation

## 2020-09-20 ENCOUNTER — Encounter: Payer: Self-pay | Admitting: Gastroenterology

## 2020-09-26 ENCOUNTER — Telehealth: Payer: Self-pay | Admitting: Endocrinology

## 2020-09-26 NOTE — Telephone Encounter (Signed)
Pt calling in stating that he has forgot what needs to be done next after the ultra sound and after the visit with the doctor . Pt would like a call back

## 2021-02-19 ENCOUNTER — Encounter: Payer: Self-pay | Admitting: Emergency Medicine

## 2021-02-19 ENCOUNTER — Other Ambulatory Visit: Payer: Self-pay

## 2021-02-19 ENCOUNTER — Emergency Department
Admission: EM | Admit: 2021-02-19 | Discharge: 2021-02-19 | Disposition: A | Discharge status: Home / Self Care | Payer: BC Managed Care – PPO | Source: Home / Self Care | Attending: Family Medicine | Admitting: Family Medicine

## 2021-02-19 DIAGNOSIS — R030 Elevated blood-pressure reading, without diagnosis of hypertension: Secondary | ICD-10-CM

## 2021-02-19 DIAGNOSIS — K4090 Unilateral inguinal hernia, without obstruction or gangrene, not specified as recurrent: Secondary | ICD-10-CM

## 2021-02-19 NOTE — ED Provider Notes (Signed)
[facility]    CSN: [id number] Arrival date & time: 02/19/21  1005      History   Chief Complaint Chief Complaint  Patient presents with   Groin Swelling    HPI Davanta Meuser KNLZJ Rodena Goldmann is a 50 y.o. male.   HPI Patient has a bulge in his right groin.  It is not causing him any pain.  He is concerned he may have a hernia.  He tried to make an appointment with [facility].  He keeps getting rescheduled.  He is here for evaluation  Past Medical History:  Diagnosis Date   Allergic rhinitis    Arthritis    High cholesterol    Hypertension    Low back pain     Patient Active Problem List   Diagnosis Date Noted   Multinodular goiter 09/03/2020   Pain in joint, pelvic region and thigh 08/17/2015    Past Surgical History:  Procedure Laterality Date   COLONOSCOPY     NO PAST SURGERIES         Home Medications    Prior to Admission medications   Medication Sig Start Date End Date Taking? Authorizing Provider  atorvastatin (LIPITOR) 10 MG tablet atorvastatin 10 mg tablet  TAKE ONE TABLET BY MOUTH EVERY DAY    [provider]  celecoxib (CELEBREX) 200 MG capsule Take 1 capsule by mouth 2 (two) times daily as needed.    [provider]  lisinopril (ZESTRIL) 20 MG tablet lisinopril 20 mg tablet    [provider]    Family History Family History  Problem Relation Age of Onset   Diabetes Mother    Heart attack Mother    Arthritis Mother    Diabetes Sister    Thyroid disease Neg Hx    Colon cancer Neg Hx    Esophageal cancer Neg Hx    Rectal cancer Neg Hx    Stomach cancer Neg Hx     Social History Social History   Tobacco Use   Smoking status: Former    Types: Cigarettes    Quit date: 2006    Years since quitting: 16.8   Smokeless tobacco: Former    Quit date: 2006  Vaping Use   Vaping Use: Never used  Substance Use Topics   Alcohol use: Yes    Comment: social   Drug use: Never      Allergies   Patient has no known allergies.   Review of Systems Review of Systems See HPI  Physical Exam Triage Vital Signs ED Triage Vitals  Enc Vitals Group     BP 02/19/21 1113 (!) 163/115     Pulse Rate 02/19/21 1113 82     Resp 02/19/21 1113 16     Temp 02/19/21 1113 97.6 F (36.4 C)     Temp Source 02/19/21 1113 Oral     SpO2 02/19/21 1113 98 %     Weight --      Height --      Head Circumference --      Peak Flow --      Pain Score 02/19/21 1112 0     Pain Loc --      Pain Edu? --      Excl. in [place]? --    No data found.  Updated Vital Signs BP (!) 163/115 (BP Location: Right Arm)   Pulse 82   Temp 97.6 F (36.4 C) (Oral)   Resp 16   SpO2  98%      Physical Exam Constitutional:      General: He is not in acute distress.    Appearance: Normal appearance. He is well-developed.  HENT:     Head: Normocephalic and atraumatic.     Mouth/Throat:     Comments: Mask is in place Eyes:     Conjunctiva/sclera: Conjunctivae normal.     Pupils: Pupils are equal, round, and reactive to light.  Cardiovascular:     Rate and Rhythm: Normal rate.  Pulmonary:     Effort: Pulmonary effort is normal. No respiratory distress.  Abdominal:     General: There is no distension.     Palpations: Abdomen is soft.  Genitourinary:    Comments: Bulging is present in right inguinal region. Musculoskeletal:        General: Normal range of motion.     Cervical back: Normal range of motion.  Skin:    General: Skin is warm and dry.  Neurological:     Mental Status: He is alert.  Psychiatric:        Mood and Affect: Mood normal.        Behavior: Behavior normal.     UC Treatments / Results  Labs (all labs ordered are listed, but only abnormal results are displayed) Labs Reviewed - No data to display  EKG   Radiology No results found.  Procedures Procedures (including critical care time)  Medications Ordered in UC Medications - No data to display  Initial  Impression / Assessment and Plan / UC Course  I have reviewed the triage vital signs and the nursing notes.  Pertinent labs & imaging results that were available during my care of the patient were reviewed by me and considered in my medical decision making (see chart for details).     See PCP for blood pressure.  Follow-up with surgeon for groinSwelling Final Clinical Impressions(s) / UC Diagnoses   Final diagnoses:  Right inguinal hernia  Elevated blood pressure reading without diagnosis of hypertension     Discharge Instructions      You need to see a general surgeon If you cannot get an appointment with [facility] [place] [facility] consider Dr Benjamine Sprague in [place] or [facility] [phone]  See your primary care doctor for your elevated blood pressure   ED Prescriptions   None    PDMP not reviewed this encounter.   Raylene Everts, MD 02/19/21 [phone]

## 2021-02-19 NOTE — ED Triage Notes (Signed)
Patient presents to Urgent Care with complaints of right groin swelling since 1 month ago. Patient reports no pain. Wanted the area evaluated to make sure is not a hernia. Use to lift weights.

## 2021-02-19 NOTE — Discharge Instructions (Addendum)
You need to see a general surgeon If you cannot get an appointment with [facility] consider Dr Benjamine Sprague in [place] or [facility] [phone]  See your primary care doctor for your elevated blood pressure

## 2021-02-21 ENCOUNTER — Telehealth: Payer: Self-pay | Admitting: Emergency Medicine

## 2021-02-21 NOTE — Telephone Encounter (Signed)
Call back to [facility] regarding need for referral to schedule an appointment w/ Dr Benjamine Sprague or another surgeon at [facility]. Pt states he wants to have surgery done prior to leaving the country on 11/303/22. Pt requests information be sent to referral coordinator. Call to [facility], confirmed fax number - most recent provider notes, insurance information, and demographic sheet faxed, also included most abdominal CT results in fax. Call back to [facility] to confirm referral information had been sent to [facility] - no other questions at this time

## 2021-02-25 ENCOUNTER — Other Ambulatory Visit: Payer: Self-pay

## 2021-02-25 ENCOUNTER — Encounter: Payer: BC Managed Care – PPO | Admitting: Endocrinology

## 2021-02-25 VITALS — BP 164/100 | HR 80 | Ht 75.5 in | Wt 229.2 lb

## 2021-02-25 NOTE — Patient Instructions (Addendum)
Your blood pressure is high today.  Please see your primary care provider soon, to have it rechecked  Let's check the biopsy, guided by the ultrasound.  you will receive a phone call, about a day and time for an appointment.

## 2021-02-25 NOTE — Progress Notes (Signed)
   Subjective:    Patient ID: [name] [name], male    DOB: April 04, 1971, 50 y.o.   MRN: [id number]  HPI  Pt is referred by Stana Bunting, PA, for nodular thyroid.  Pt was noted to have a thyroid nodule in 2022.  He is unaware of ever having had thyroid problems in the past.  He has no h/o XRT or surgery to the neck.   [place]: MNG with Size: 5.6 x 2.7 x 3.3 cm (long x AP x trans).  Isthmus multilobulated.       Review of Systems Denies neck pain    Objective:   Physical Exam VITAL SIGNS:  See vs page GENERAL: no distress.  NECK: I am uncertain if I can palpate the nodule #1          Assessment & Plan:

## 2021-03-27 ENCOUNTER — Ambulatory Visit
Admission: RE | Admit: 2021-03-27 | Discharge: 2021-03-27 | Disposition: A | Discharge status: Home / Self Care | Payer: BC Managed Care – PPO | Source: Ambulatory Visit | Attending: Endocrinology | Admitting: Endocrinology

## 2021-03-27 ENCOUNTER — Other Ambulatory Visit (HOSPITAL_COMMUNITY)
Admission: RE | Admit: 2021-03-27 | Discharge: 2021-03-27 | Disposition: A | Discharge status: Home / Self Care | Payer: BC Managed Care – PPO | Source: Ambulatory Visit | Attending: Radiology | Admitting: Radiology

## 2021-03-27 DIAGNOSIS — E041 Nontoxic single thyroid nodule: Secondary | ICD-10-CM | POA: Insufficient documentation

## 2021-03-27 DIAGNOSIS — E042 Nontoxic multinodular goiter: Secondary | ICD-10-CM

## 2021-03-28 LAB — CYTOLOGY - NON PAP

## 2021-04-11 ENCOUNTER — Encounter (HOSPITAL_COMMUNITY): Payer: Self-pay

## 2021-04-13 ENCOUNTER — Encounter: Payer: Self-pay | Admitting: Endocrinology

## 2021-04-13 ENCOUNTER — Other Ambulatory Visit: Payer: Self-pay | Admitting: Endocrinology

## 2021-04-13 DIAGNOSIS — E042 Nontoxic multinodular goiter: Secondary | ICD-10-CM

## 2021-04-26 DIAGNOSIS — C73 Malignant neoplasm of thyroid gland: Secondary | ICD-10-CM | POA: Insufficient documentation

## 2021-05-06 ENCOUNTER — Ambulatory Visit: Payer: BC Managed Care – PPO | Admitting: Endocrinology

## 2021-05-16 ENCOUNTER — Other Ambulatory Visit: Payer: Self-pay

## 2021-05-16 ENCOUNTER — Ambulatory Visit: Payer: BC Managed Care – PPO | Admitting: Endocrinology

## 2021-05-16 VITALS — BP 160/104 | HR 83 | Ht 75.5 in | Wt 232.4 lb

## 2021-05-16 DIAGNOSIS — E042 Nontoxic multinodular goiter: Secondary | ICD-10-CM | POA: Diagnosis not present

## 2021-05-16 NOTE — Patient Instructions (Addendum)
Your blood pressure is high today.  Please see your primary care provider soon, to have it rechecked.  It is important that this be normalized before your surgery.  I agree with your plan for the surgery.   please come back for a follow-up appointment in 4 weeks (1 week after the operation).

## 2021-05-16 NOTE — Progress Notes (Signed)
Subjective:    Patient ID: [name] [name], male    DOB: 1970/08/21, 51 y.o.   MRN: [id number]  HPI Pt returns for f/u of thyroid nodule (dx'ed 2022); Afirma was high risk  Surg is planned for 06/06/21.  pt states he feels well in general.   Past Medical History:  Diagnosis Date   Allergic rhinitis    Arthritis    High cholesterol    Hypertension    Low back pain     Past Surgical History:  Procedure Laterality Date   COLONOSCOPY     NO PAST SURGERIES      Social History   Socioeconomic History   Marital status: Divorced    Spouse name: Not on file   Number of children: 0   Years of education: Not on file   Highest education level: Not on file  Occupational History   Occupation: Truck Geophysicist/field seismologist  Tobacco Use   Smoking status: Former    Types: Cigarettes    Quit date: 2006    Years since quitting: 17.1   Smokeless tobacco: Former    Quit date: 2006  Vaping Use   Vaping Use: Never used  Substance and Sexual Activity   Alcohol use: Yes    Comment: social   Drug use: Never   Sexual activity: Not on file  Other Topics Concern   Not on file  Social History Narrative   Not on file   Social Determinants of Health   Financial Resource Strain: Not on file  Food Insecurity: Not on file  Transportation Needs: Not on file  Physical Activity: Not on file  Stress: Not on file  Social Connections: Not on file  Intimate Partner Violence: Not on file    Current Outpatient Medications on File Prior to Visit  Medication Sig Dispense Refill   atorvastatin (LIPITOR) 10 MG tablet atorvastatin 10 mg tablet  TAKE ONE TABLET BY MOUTH EVERY DAY     celecoxib (CELEBREX) 200 MG capsule Take 1 capsule by mouth 2 (two) times daily as needed.     lisinopril (ZESTRIL) 20 MG tablet lisinopril 20 mg tablet     No current facility-administered medications on file prior to visit.    No Known Allergies  Family History  Problem Relation Age of Onset   Diabetes Mother     Heart attack Mother    Arthritis Mother    Diabetes Sister    Thyroid disease Neg Hx    Colon cancer Neg Hx    Esophageal cancer Neg Hx    Rectal cancer Neg Hx    Stomach cancer Neg Hx     BP (!) 160/104    Pulse 83    Ht 6' 3.5" (1.918 m)    Wt 232 lb 6.4 oz (105.4 kg)    SpO2 97%    BMI 28.66 kg/m    Review of Systems     Objective:   Physical Exam VITAL SIGNS:  See vs page GENERAL: no distress NECK: swelling to the right of the isthmus is noted.  No palp LN's.     CT: isthmus nodule with bilat LN's.      Assessment & Plan:  Thyroid nodule, high risk for malignancy.  We discussed.  HTN is noted today  Patient Instructions  Your blood pressure is high today.  Please see your primary care provider soon, to have it rechecked.  It is important that this be normalized before your surgery.  I agree  with your plan for the surgery.   please come back for a follow-up appointment in 4 weeks (1 week after the operation).

## 2021-06-13 ENCOUNTER — Ambulatory Visit: Payer: BC Managed Care – PPO | Admitting: Endocrinology

## 2021-06-21 ENCOUNTER — Ambulatory Visit: Payer: BC Managed Care – PPO | Admitting: Endocrinology

## 2021-06-23 ENCOUNTER — Ambulatory Visit
Admission: EM | Admit: 2021-06-23 | Discharge: 2021-06-23 | Disposition: A | Discharge status: Home / Self Care | Payer: BC Managed Care – PPO | Attending: Internal Medicine | Admitting: Internal Medicine

## 2021-06-23 ENCOUNTER — Encounter: Payer: Self-pay | Admitting: Emergency Medicine

## 2021-06-23 DIAGNOSIS — J029 Acute pharyngitis, unspecified: Secondary | ICD-10-CM | POA: Insufficient documentation

## 2021-06-23 DIAGNOSIS — J069 Acute upper respiratory infection, unspecified: Secondary | ICD-10-CM | POA: Diagnosis not present

## 2021-06-23 LAB — POCT RAPID STREP A (OFFICE): Rapid Strep A Screen: NEGATIVE

## 2021-06-23 MED ORDER — FLUTICASONE PROPIONATE 50 MCG/ACT NA SUSP: 1.0000 | Freq: Every day | NASAL | 0 refills | Status: AC

## 2021-06-23 MED ORDER — BENZONATATE 100 MG PO CAPS: 100.0000 mg | ORAL_CAPSULE | Freq: Three times a day (TID) | ORAL | 0 refills | Status: DC | PRN

## 2021-06-23 NOTE — ED Triage Notes (Signed)
Patient c/o productive cough, congestion and sore throat x 5 days.  Nasal drainage, no ear pain.  Patient has taken Zyrtec, Alka Seltzer. ?

## 2021-06-23 NOTE — ED Provider Notes (Signed)
?[facility] ? ? ? ?CSN: [id number] ?Arrival date & time: 06/23/21  1427 ? ? ?  ? ?History   ?Chief Complaint ?Chief Complaint  ?Patient presents with  ? Cough  ? ? ?HPI ?[name] [name] is a 51 y.o. male.  ? ?Patient presents with cough, nasal congestion, sore throat that has been present for approximately 5 days.  Denies any known sick contacts or fevers.  Denies chest pain, shortness of breath, ear pain, nausea, vomiting, diarrhea, abdominal pain.  Patient has taken over-the-counter cough and cold medications with minimal improvement in symptoms. ? ? ?Cough ? ?Past Medical History:  ?Diagnosis Date  ? Allergic rhinitis   ? Arthritis   ? High cholesterol   ? Hypertension   ? Low back pain   ? ? ?Patient Active Problem List  ? Diagnosis Date Noted  ? Multinodular goiter 09/03/2020  ? Pain in joint, pelvic region and thigh 08/17/2015  ? ? ?Past Surgical History:  ?Procedure Laterality Date  ? COLONOSCOPY    ? NO PAST SURGERIES    ? ? ? ? ? ?Home Medications   ? ?Prior to Admission medications   ?Medication Sig Start Date End Date Taking? Authorizing Provider  ?atorvastatin (LIPITOR) 10 MG tablet atorvastatin 10 mg tablet ? TAKE ONE TABLET BY MOUTH EVERY DAY   Yes [provider]  ?benzonatate (TESSALON) 100 MG capsule Take 1 capsule (100 mg total) by mouth every 8 (eight) hours as needed for cough. 06/23/21  Yes Teodora Medici, FNP  ?fluticasone (FLONASE) 50 MCG/ACT nasal spray Place 1 spray into both nostrils daily for 3 days. 06/23/21 06/26/21 Yes Teodora Medici, FNP  ?lisinopril (ZESTRIL) 20 MG tablet lisinopril 20 mg tablet   Yes [provider]  ?celecoxib (CELEBREX) 200 MG capsule Take 1 capsule by mouth 2 (two) times daily as needed.    [provider]  ? ? ?Family History ?Family History  ?Problem Relation Age of Onset  ? Diabetes Mother   ? Heart attack Mother   ? Arthritis Mother   ? Diabetes Sister   ? Thyroid disease Neg Hx   ? Colon cancer Neg Hx   ?  Esophageal cancer Neg Hx   ? Rectal cancer Neg Hx   ? Stomach cancer Neg Hx   ? ? ?Social History ?Social History  ? ?Tobacco Use  ? Smoking status: Former  ?  Types: Cigarettes  ?  Quit date: 2006  ?  Years since quitting: 17.2  ? Smokeless tobacco: Former  ?  Quit date: 2006  ?Vaping Use  ? Vaping Use: Never used  ?Substance Use Topics  ? Alcohol use: Yes  ?  Comment: social  ? Drug use: Never  ? ? ? ?Allergies   ?Patient has no known allergies. ? ? ?Review of Systems ?Review of Systems ?Per HPI ? ?Physical Exam ?Triage Vital Signs ?ED Triage Vitals  ?Enc Vitals Group  ?   BP 06/23/21 1512 (!) 143/101  ?   Pulse Rate 06/23/21 1512 89  ?   Resp 06/23/21 1512 18  ?   Temp 06/23/21 1512 98.1 ?F (36.7 ?C)  ?   Temp Source 06/23/21 1512 Oral  ?   SpO2 06/23/21 1512 97 %  ?   Weight 06/23/21 1513 232 lb 5.8 oz (105.4 kg)  ?   Height 06/23/21 1513 6' 3.5" (1.918 m)  ?   Head Circumference --   ?   Peak Flow --   ?  Pain Score 06/23/21 1513 0  ?   Pain Loc --   ?   Pain Edu? --   ?   Excl. in [place]? --   ? ?No data found. ? ?Updated Vital Signs ?BP (!) 143/101 (BP Location: Right Arm)   Pulse 89   Temp 98.1 ?F (36.7 ?C) (Oral)   Resp 18   Ht 6' 3.5" (1.918 m)   Wt 232 lb 5.8 oz (105.4 kg)   SpO2 97%   BMI 28.66 kg/m?  ? ?Visual Acuity ?Right Eye Distance:   ?Left Eye Distance:   ?Bilateral Distance:   ? ?Right Eye Near:   ?Left Eye Near:    ?Bilateral Near:    ? ?Physical Exam ?Constitutional:   ?   General: He is not in acute distress. ?   Appearance: Normal appearance. He is not toxic-appearing or diaphoretic.  ?HENT:  ?   Head: Normocephalic and atraumatic.  ?   Right Ear: Tympanic membrane and ear canal normal.  ?   Left Ear: Tympanic membrane and ear canal normal.  ?   Nose: Congestion present.  ?   Mouth/Throat:  ?   Mouth: Mucous membranes are moist.  ?   Pharynx: Posterior oropharyngeal erythema present.  ?Eyes:  ?   Extraocular Movements: Extraocular movements intact.  ?   Conjunctiva/sclera: Conjunctivae  normal.  ?   Pupils: Pupils are equal, round, and reactive to light.  ?Cardiovascular:  ?   Rate and Rhythm: Normal rate and regular rhythm.  ?   Pulses: Normal pulses.  ?   Heart sounds: Normal heart sounds.  ?Pulmonary:  ?   Effort: Pulmonary effort is normal. No respiratory distress.  ?   Breath sounds: Normal breath sounds. No stridor. No wheezing, rhonchi or rales.  ?Abdominal:  ?   General: Abdomen is flat. Bowel sounds are normal.  ?   Palpations: Abdomen is soft.  ?Musculoskeletal:     ?   General: Normal range of motion.  ?   Cervical back: Normal range of motion.  ?Skin: ?   General: Skin is warm and dry.  ?Neurological:  ?   General: No focal deficit present.  ?   Mental Status: He is alert and oriented to person, place, and time. Mental status is at baseline.  ?Psychiatric:     ?   Mood and Affect: Mood normal.     ?   Behavior: Behavior normal.  ? ? ? ?UC Treatments / Results  ?Labs ?(all labs ordered are listed, but only abnormal results are displayed) ?Labs Reviewed  ?CULTURE, GROUP A STREP [facility])  ?COVID-19, FLU A+B NAA  ?POCT RAPID STREP A (OFFICE)  ? ? ?EKG ? ? ?Radiology ?No results found. ? ?Procedures ?Procedures (including critical care time) ? ?Medications Ordered in UC ?Medications - No data to display ? ?Initial Impression / Assessment and Plan / UC Course  ?I have reviewed the triage vital signs and the nursing notes. ? ?Pertinent labs & imaging results that were available during my care of the patient were reviewed by me and considered in my medical decision making (see chart for details). ? ?  ? ?Patient presents with symptoms likely from a viral upper respiratory infection. Differential includes bacterial pneumonia, sinusitis, allergic rhinitis, COVID-19, flu. Do not suspect underlying cardiopulmonary process. Symptoms seem unlikely related to ACS, CHF or COPD exacerbations, pneumonia, pneumothorax. Patient is nontoxic appearing and not in need of emergent medical intervention.  Rapid  strep was negative.  Throat culture, COVID-19, flu test pending. ? ?Recommended symptom control with over the counter medications.   Patient sent prescriptions. ? ?Return if symptoms fail to improve in 1-2 weeks or you develop shortness of breath, chest pain, severe headache. Patient states understanding and is agreeable. ? ?Discharged with PCP followup.  ?Final Clinical Impressions(s) / UC Diagnoses  ? ?Final diagnoses:  ?Viral upper respiratory tract infection with cough  ?Sore throat  ? ? ? ?Discharge Instructions   ? ?  ?You have been prescribed 2 medications to help alleviate symptoms.  It appears that you have a viral upper respiratory infection that should self resolve in the next few days with symptomatic treatment.  Rapid strep was negative.  Throat culture, COVID-19, flu test is pending. ? ? ? ? ?ED Prescriptions   ? ? Medication Sig Dispense Auth. Provider  ? benzonatate (TESSALON) 100 MG capsule Take 1 capsule (100 mg total) by mouth every 8 (eight) hours as needed for cough. 21 capsule Teodora Medici, [place]  ? fluticasone (FLONASE) 50 MCG/ACT nasal spray Place 1 spray into both nostrils daily for 3 days. 16 g Teodora Medici, [place]  ? ?  ? ?PDMP not reviewed this encounter. ?  ?Teodora Medici, [place] ?06/23/21 1553 ? ?

## 2021-06-23 NOTE — Discharge Instructions (Signed)
You have been prescribed 2 medications to help alleviate symptoms.  It appears that you have a viral upper respiratory infection that should self resolve in the next few days with symptomatic treatment.  Rapid strep was negative.  Throat culture, COVID-19, flu test is pending. ?

## 2021-06-24 DIAGNOSIS — E890 Postprocedural hypothyroidism: Secondary | ICD-10-CM | POA: Insufficient documentation

## 2021-06-24 LAB — COVID-19, FLU A+B NAA
Influenza A, NAA: NOT DETECTED
Influenza B, NAA: NOT DETECTED
SARS-CoV-2, NAA: NOT DETECTED

## 2021-06-26 LAB — CULTURE, GROUP A STREP (THRC)

## 2021-10-09 ENCOUNTER — Emergency Department (HOSPITAL_COMMUNITY)
Admission: EM | Admit: 2021-10-09 | Discharge: 2021-10-09 | Disposition: A | Discharge status: Home / Self Care | Payer: Commercial Managed Care - PPO | Attending: Emergency Medicine | Admitting: Emergency Medicine

## 2021-10-09 ENCOUNTER — Other Ambulatory Visit: Payer: Self-pay

## 2021-10-09 DIAGNOSIS — W260XXA Contact with knife, initial encounter: Secondary | ICD-10-CM | POA: Insufficient documentation

## 2021-10-09 DIAGNOSIS — S6992XA Unspecified injury of left wrist, hand and finger(s), initial encounter: Secondary | ICD-10-CM | POA: Diagnosis present

## 2021-10-09 DIAGNOSIS — S61412A Laceration without foreign body of left hand, initial encounter: Secondary | ICD-10-CM | POA: Insufficient documentation

## 2021-10-09 DIAGNOSIS — Z23 Encounter for immunization: Secondary | ICD-10-CM | POA: Insufficient documentation

## 2021-10-09 MED ORDER — TETANUS-DIPHTH-ACELL PERTUSSIS 5-2.5-18.5 LF-MCG/0.5 IM SUSY
0.5000 mL | PREFILLED_SYRINGE | Freq: Once | INTRAMUSCULAR | Status: AC
  Administered 2021-10-09: 0.5 mL via INTRAMUSCULAR
  Filled 2021-10-09: qty 0.5

## 2021-10-09 MED ORDER — LIDOCAINE HCL (PF) 1 % IJ SOLN
10.0000 mL | Freq: Once | INTRAMUSCULAR | Status: AC
  Administered 2021-10-09: 10 mL
  Filled 2021-10-09: qty 30

## 2021-10-09 MED ORDER — CEPHALEXIN 500 MG PO CAPS: 500.0000 mg | ORAL_CAPSULE | Freq: Three times a day (TID) | ORAL | 0 refills | Status: DC

## 2021-10-09 MED ORDER — TETANUS-DIPHTHERIA TOXOIDS TD 5-2 LFU IM INJ: 0.5000 mL | INJECTION | Freq: Once | INTRAMUSCULAR | Status: DC

## 2021-10-09 NOTE — ED Triage Notes (Signed)
Patient coming to ED for evaluation of laceration to L thumb.  Reports he was cutting lamb with a knife and cut finger.  Bleeding controlled at this time

## 2021-10-09 NOTE — Discharge Instructions (Addendum)
Your laceration was repaired with 4 stitches.  These will need to be removed in 5 to 7 days.  You can go to your primary care provider, urgent care, or return to the emergency room.  If you notice any signs of swelling in this area, redness, drainage from the laceration site please return for evaluation as these are signs of infection.  I have sent antibiotics into the pharmacy for you.

## 2021-10-09 NOTE — ED Provider Triage Note (Signed)
Emergency Medicine Provider Triage Evaluation Note  [name] [name] , a 51 y.o. male  was evaluated in triage.  Pt complains of laceration to back of thumb on left hand.  Occurred from a knife.  Believes he is up-to-date on tetanus..  Review of Systems  Positive: As above Negative: As above  Physical Exam  There were no vitals taken for this visit. Gen:   Awake, no distress   Resp:  Normal effort  MSK:   Moves extremities without difficulty Other:  About 3 to 4 cm laceration noted to back of left thumb.  Medical Decision Making  Medically screening exam initiated at 9:36 PM.  Appropriate orders placed.  [name] [name] was informed that the remainder of the evaluation will be completed by another provider, this initial triage assessment does not replace that evaluation, and the importance of remaining in the ED until their evaluation is complete.     Evlyn Courier, PA-C 10/09/21 2137

## 2021-10-09 NOTE — ED Provider Notes (Signed)
[facility] DEPT Provider Note   CSN: [id number] Arrival date & time: 10/09/21  2130     History  Chief Complaint  Patient presents with   Laceration    [name] [name] is a 51 y.o. male.  51 year old male presents today for evaluation of laceration to back of left thumb.  Patient was skinning an animal when this happened.  He is unsure of his last tetanus shot.  Denies any other complaints.  Bleeding is controlled.  The history is provided by the patient. No language interpreter was used.       Home Medications Prior to Admission medications   Medication Sig Start Date End Date Taking? Authorizing Provider  atorvastatin (LIPITOR) 10 MG tablet atorvastatin 10 mg tablet  TAKE ONE TABLET BY MOUTH EVERY DAY    [provider]  benzonatate (TESSALON) 100 MG capsule Take 1 capsule (100 mg total) by mouth every 8 (eight) hours as needed for cough. 06/23/21   Teodora Medici, FNP  celecoxib (CELEBREX) 200 MG capsule Take 1 capsule by mouth 2 (two) times daily as needed.    [provider]  fluticasone (FLONASE) 50 MCG/ACT nasal spray Place 1 spray into both nostrils daily for 3 days. 06/23/21 06/26/21  Teodora Medici, FNP  lisinopril (ZESTRIL) 20 MG tablet lisinopril 20 mg tablet    [provider]      Allergies    Patient has no known allergies.    Review of Systems   Review of Systems  Constitutional:  Negative for fever.  Skin:  Positive for wound.  All other systems reviewed and are negative.   Physical Exam Updated Vital Signs BP (!) 180/130 (BP Location: Left Arm)   Pulse 100   Temp 98.3 F (36.8 C)   Resp 18   Ht '6\' 5"'$  (1.956 m)   Wt 98.4 kg   SpO2 99%   BMI 25.73 kg/m  Physical Exam Vitals and nursing note reviewed.  Constitutional:      General: He is not in acute distress.    Appearance: Normal appearance. He is not ill-appearing.  HENT:     Head: Normocephalic and atraumatic.      Nose: Nose normal.  Eyes:     Conjunctiva/sclera: Conjunctivae normal.  Pulmonary:     Effort: Pulmonary effort is normal. No respiratory distress.  Musculoskeletal:        General: No deformity.  Skin:    Findings: No rash.     Comments: About 4 cm laceration noted to posterior left thumb.  No active bleeding.  Brisk cap refill.  2+ radial pulse present.  Distal sensation intact.  Neurological:     Mental Status: He is alert.     ED Results / Procedures / Treatments   Labs (all labs ordered are listed, but only abnormal results are displayed) Labs Reviewed - No data to display  EKG None  Radiology No results found.  Procedures .Marland KitchenLaceration Repair  Date/Time: 10/09/2021 11:23 PM  Performed by: Evlyn Courier, PA-C Authorized by: Evlyn Courier, PA-C   Consent:    Consent obtained:  Verbal   Consent given by:  Patient   Risks discussed:  Infection, need for additional repair, pain, poor cosmetic result and poor wound healing   Alternatives discussed:  No treatment and delayed treatment Universal protocol:    Procedure explained and questions answered to patient or proxy's satisfaction: yes     Relevant documents present and verified: yes  Patient identity confirmed:  Verbally with patient Anesthesia:    Anesthesia method:  Local infiltration   Local anesthetic:  Lidocaine 1% w/o epi Laceration details:    Location:  Hand   Hand location:  L hand, dorsum   Length (cm):  4 Pre-procedure details:    Preparation:  Patient was prepped and draped in usual sterile fashion Treatment:    Area cleansed with:  Povidone-iodine and saline   Amount of cleaning:  Extensive   Debridement:  None   Undermining:  None Skin repair:    Repair method:  Sutures   Suture size:  5-0   Suture material:  Prolene   Number of sutures:  4 Repair type:    Repair type:  Simple Post-procedure details:    Dressing:  Open (no dressing)   Procedure completion:  Tolerated well, no immediate  complications     Medications Ordered in ED Medications  lidocaine (PF) (XYLOCAINE) 1 % injection 10 mL (has no administration in time range)    ED Course/ Medical Decision Making/ A&P                           Medical Decision Making Risk Prescription drug management.   51 year old male presents today with laceration.  Bleeding controlled.  About 4 cm.  Unsure of his last tetanus shot.  Wound extensively cleansed.  Laceration repaired with 4 stitches.  Antibiotics prescribed given concern for dirty knife.  Laceration care discussed.  Return precautions discussed.  Patient voices understanding and is in agreement with plan. Tetanus shot updated today.  Final Clinical Impression(s) / ED Diagnoses Final diagnoses:  Laceration of left hand without foreign body, initial encounter    Rx / DC Orders ED Discharge Orders          Ordered    cephALEXin (KEFLEX) 500 MG capsule  3 times daily        10/09/21 2325              Evlyn Courier, PA-C 10/09/21 2326    Jeanell Sparrow, DO 10/12/21 0021

## 2021-10-11 DIAGNOSIS — Z87828 Personal history of other (healed) physical injury and trauma: Secondary | ICD-10-CM | POA: Insufficient documentation

## 2021-12-18 ENCOUNTER — Ambulatory Visit: Payer: Self-pay

## 2021-12-18 ENCOUNTER — Ambulatory Visit (INDEPENDENT_AMBULATORY_CARE_PROVIDER_SITE_OTHER): Payer: Commercial Managed Care - PPO

## 2021-12-18 ENCOUNTER — Encounter: Payer: Self-pay | Admitting: Orthopedic Surgery

## 2021-12-18 ENCOUNTER — Ambulatory Visit (INDEPENDENT_AMBULATORY_CARE_PROVIDER_SITE_OTHER): Payer: Commercial Managed Care - PPO | Admitting: Orthopedic Surgery

## 2021-12-18 DIAGNOSIS — M79604 Pain in right leg: Secondary | ICD-10-CM

## 2021-12-18 DIAGNOSIS — M1611 Unilateral primary osteoarthritis, right hip: Secondary | ICD-10-CM

## 2021-12-18 NOTE — Progress Notes (Signed)
Office Visit Note   Patient: [name] [name]           Date of Birth: 24-Feb-1971           MRN: [id number] Visit Date: 12/18/2021 Requested by: Stana Bunting, PA-C [place],  [place] [id number] PCP: Stana Bunting, PA-C  Subjective: Chief Complaint  Patient presents with   Right Leg - Pain    HPI: [name] [name] is a 51 y.o. male who presents to the office complaining of right hip pain.  Patient states that he has had 10 years of primarily groin pain in the right hip.  He has occasional radiation to the knee joint anteriorly.  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  Not taking anything for pain.  No history of prior hip or back surgery.  He did have a prior right knee injection and aspiration that gave him 2 weeks of relief several years ago.  He notes numbness and tingling in his leg at times but nothing consistent.  A lot of his pain is associated with AB ducting his hip.  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  No posterior pain in the hip, buttock pain.  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                ROS: All systems reviewed are negative as they relate to the chief complaint within the history of present illness.  Patient denies fevers or chills.  Assessment & Plan: Visit Diagnoses:  1. Pain in right leg     Plan: Patient is a 51 year old male who presents for evaluation of right hip pain that he mostly localizes to the groin.  Occasional radiation into the knee.  Has had this for 10 years.  Radiographs today demonstrate severe degenerative changes of the right hip joint with osteophyte formation, joint space narrowing.  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  Not really much contribution of pain from the low back or any radicular pain.  No knee effusion on exam today and not really any tenderness throughout the knee joint.  Seems mostly localized to the hip.  Discussed the options available to patient today which mainly include living with his symptoms continually, trying intra-articular injection today, pursuing hip replacement surgery.  His radiographic findings and long duration of pain suggest that hip replacement is of reasonable option for him but he would like to forego any operative intervention at this time.  He would like to try the hip injection.  Under ultrasound guidance, cortisone injection was successfully delivered into the hip joint today.  Tolerated procedure well.  He understands that due to the severity of his hip arthritis, this injection may only give him several days or weeks of relief.  Follow-up in 6 weeks for clinical recheck and discussion on where to go from there.  Follow-Up Instructions: No follow-ups on file.   Orders:  Orders Placed This Encounter  Procedures   XR HIP UNILAT W OR W/O PELVIS 2-3 VIEWS RIGHT   XR Knee 1-2 Views Right   XR Lumbar Spine 2-3 Views   US Guided Needle  Placement - No Linked Charges   No orders of the defined types were placed in this encounter.     Procedures: Large Joint Inj: R hip joint on 12/18/2021 7:40 PM Indications: pain and diagnostic evaluation Details: 22 G 3.5 in needle, ultrasound-guided lateral approach  Arthrogram: No  Medications: 5 mL lidocaine 1 %; 4 mL bupivacaine 0.25 %; 40 mg methylPREDNISolone acetate 40 MG/ML Outcome: tolerated well, no immediate complications Procedure, treatment alternatives, risks and benefits explained, specific risks discussed. Consent was given by the patient. Immediately prior to procedure a time out was called to verify the correct patient, procedure, equipment, support staff and site/side marked  as required. Patient was prepped and draped in the usual sterile fashion.       Clinical Data: No additional findings.  Objective: Vital Signs: There were no vitals taken for this visit.  Physical Exam:  Constitutional: Patient appears well-developed HEENT:  Head: Normocephalic Eyes:EOM are normal Neck: Normal range of motion Cardiovascular: Normal rate Pulmonary/chest: Effort normal Neurologic: Patient is alert Skin: Skin is warm Psychiatric: Patient has normal mood and affect  Ortho Exam: Ortho exam demonstrates positive FADIR test.  Positive Stinchfield sign.  No tenderness over the greater trochanter of the right hip.  Negative straight leg raise.  No knee effusion noted.  No tenderness over the medial or lateral joint lines of the right knee.  Able to perform straight leg raise without extensor lag of the right knee.  No tenderness over the patellar tendon, patella, quadricep tendon.  No pain with logroll of the right hip.    Specialty Comments:  No specialty comments available.  Imaging: XR Knee 1-2 Views Right  Result Date: 12/18/2021 AP lateral radiographs right knee reviewed.  Alignment intact.  No acute fracture.  Joint spaces maintained in the medial lateral and patellofemoral compartments.  XR Lumbar Spine 2-3 Views  Result Date: 12/18/2021 AP lateral oblique radiographs lumbar spine reviewed.  Normal lordosis present.  Joint spaces maintained between the vertebral bodies.  No facet arthritis.  No spondylolisthesis.  Normal radiographs lumbar spine  XR HIP UNILAT W OR W/O PELVIS 2-3 VIEWS RIGHT  Result Date: 12/18/2021 AP pelvis lateral right hip reviewed.  Severe end-stage arthritis is present in the right hip with loss of joint space and findings consistent with avascular necrosis.  No acute fracture.  Similar findings to a lesser degree present on the left-hand side.  Remainder bony pelvis unremarkable.    PMFS History: Patient Active Problem List   Diagnosis  Date Noted   Multinodular goiter 09/03/2020   Pain in joint, pelvic region and thigh 08/17/2015   Past Medical History:  Diagnosis Date   Allergic rhinitis    Arthritis    High cholesterol    Hypertension    Low back pain     Family History  Problem Relation Age of Onset   Diabetes Mother    Heart attack Mother    Arthritis Mother    Diabetes Sister    Thyroid disease Neg Hx    Colon cancer Neg Hx    Esophageal cancer Neg Hx    Rectal cancer Neg Hx    Stomach cancer Neg Hx     Past Surgical History:  Procedure Laterality Date   COLONOSCOPY     NO PAST SURGERIES     Social History   Occupational History   Occupation: Truck Geophysicist/field seismologist  Tobacco Use   Smoking status: Former    Types: Cigarettes    Quit date:  2006    Years since quitting: 17.6   Smokeless tobacco: Former    Quit date: 2006  Vaping Use   Vaping Use: Never used  Substance and Sexual Activity   Alcohol use: Yes    Comment: social   Drug use: Never   Sexual activity: Not on file

## 2021-12-19 ENCOUNTER — Encounter: Payer: Self-pay | Admitting: Orthopedic Surgery

## 2021-12-19 MED ORDER — LIDOCAINE HCL 1 % IJ SOLN
5.0000 mL | INTRAMUSCULAR | Status: AC | PRN
  Administered 2021-12-18: 5 mL

## 2021-12-19 MED ORDER — BUPIVACAINE HCL 0.25 % IJ SOLN
4.0000 mL | INTRAMUSCULAR | Status: AC | PRN
  Administered 2021-12-18: 4 mL via INTRA_ARTICULAR

## 2021-12-19 MED ORDER — METHYLPREDNISOLONE ACETATE 40 MG/ML IJ SUSP
40.0000 mg | INTRAMUSCULAR | Status: AC | PRN
  Administered 2021-12-18: 40 mg via INTRA_ARTICULAR

## 2022-03-01 DIAGNOSIS — M1612 Unilateral primary osteoarthritis, left hip: Secondary | ICD-10-CM | POA: Insufficient documentation

## 2022-03-20 DIAGNOSIS — K1120 Sialoadenitis, unspecified: Secondary | ICD-10-CM | POA: Insufficient documentation

## 2022-03-21 DIAGNOSIS — K115 Sialolithiasis: Secondary | ICD-10-CM | POA: Insufficient documentation

## 2022-05-01 IMAGING — US US FNA BIOPSY THYROID 1ST LESION
1 series · 13 of 13 positions shown · non-contrast
Comparison: US 06/11/20

MEDICATIONS:
5 cc 1% lidocaine

COMPLICATIONS:
None immediate.

INDICATION: Isthmus nodule

5.6 cm
EXAM:
ULTRASOUND GUIDED FINE NEEDLE ASPIRATION OF INDETERMINATE THYROID
NODULE
TECHNIQUE: Informed written consent was obtained from the patient after a
discussion of the risks, benefits and alternatives to treatment.
Questions regarding the procedure were encouraged and answered. A
timeout was performed prior to the initiation of the procedure.

[Series 1: us fna biopsy thyroid 1st lesion · 0.06mm/px · 13 acquisitions, 13 frames shown]
[im 1/13]
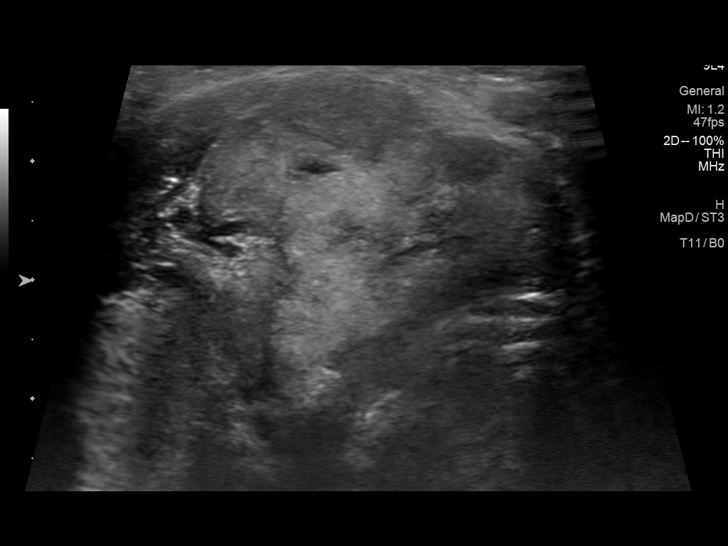
[im 2/13]
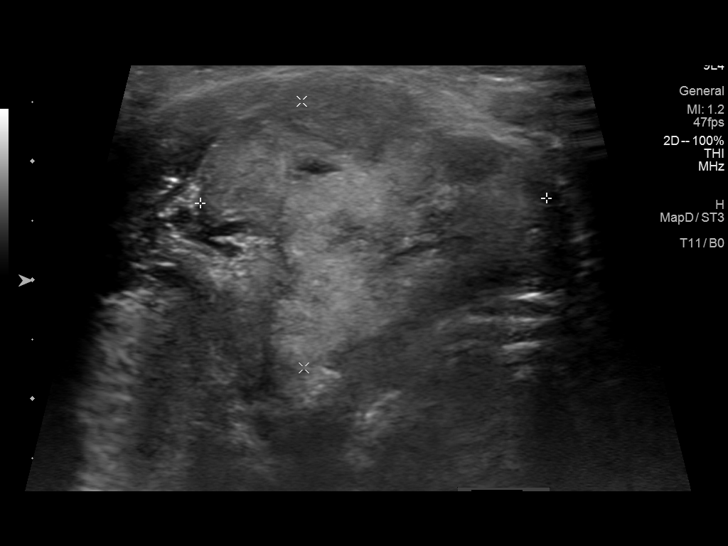
[im 3/13]
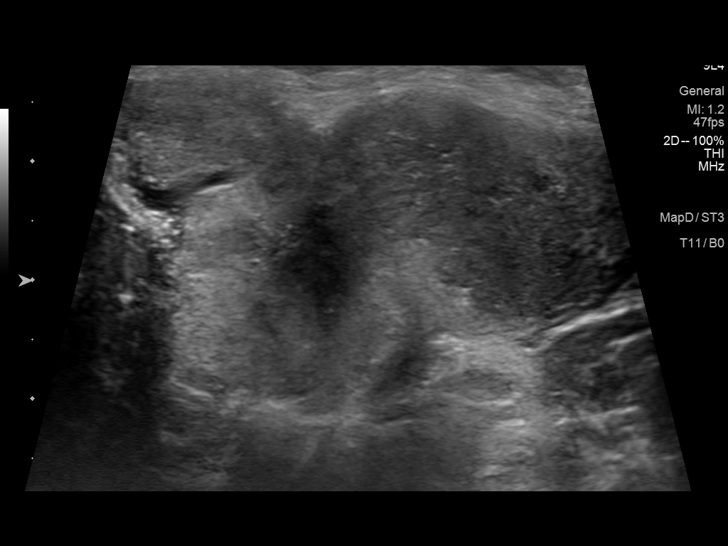
[im 4/13]
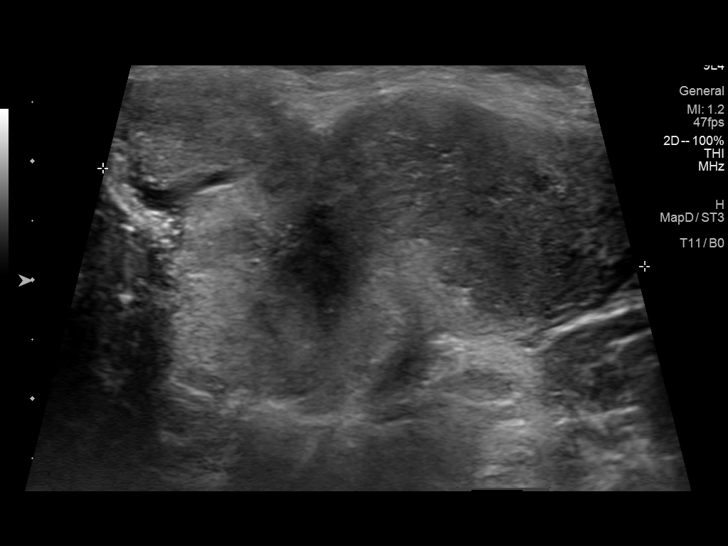
[im 5/13]
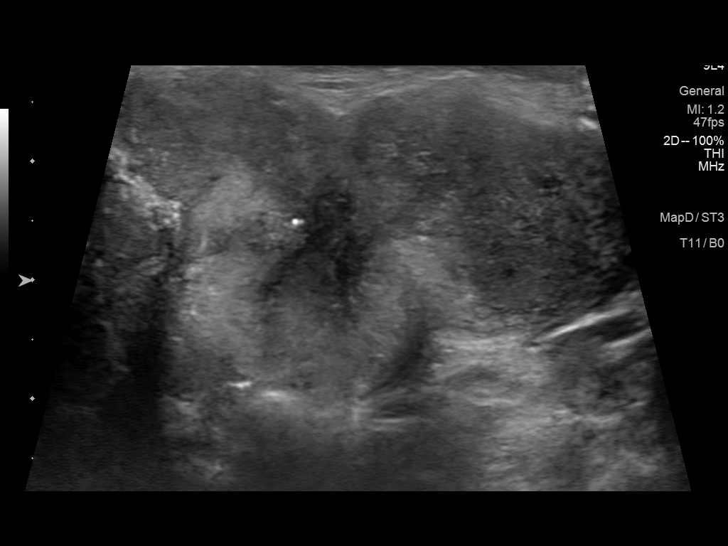
[im 6/13]
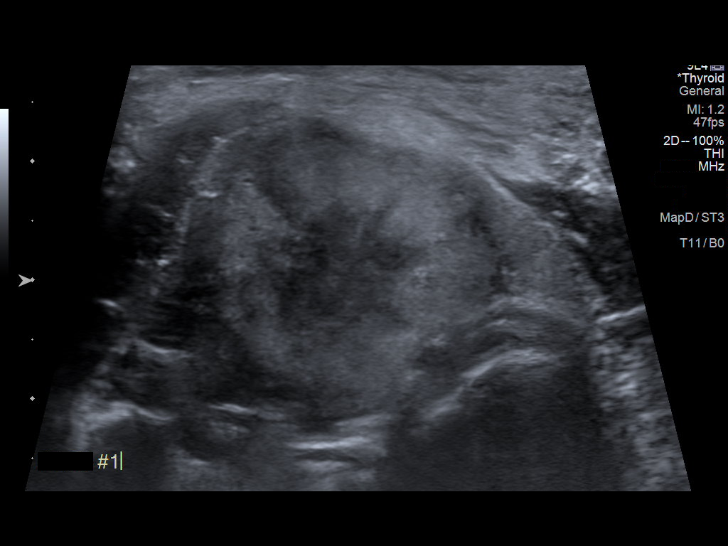
[im 7/13]
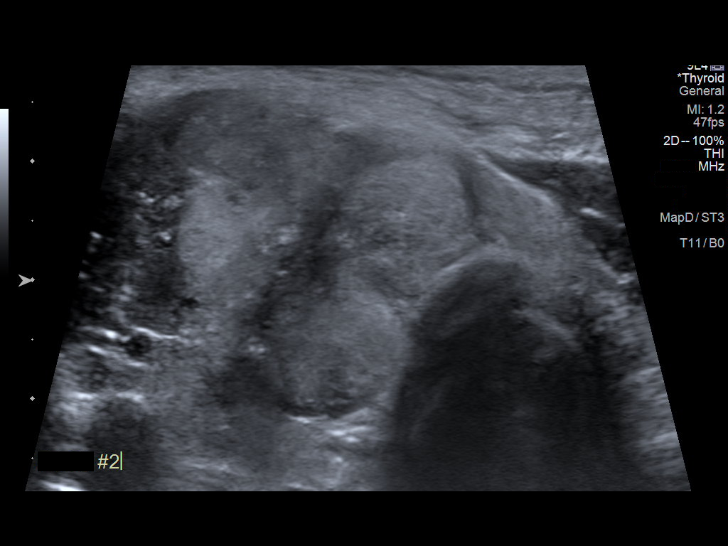
[im 8/13]
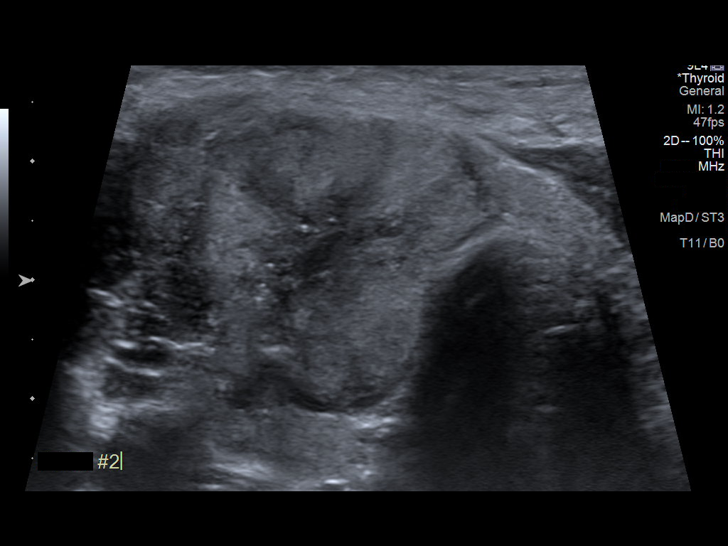
[im 9/13]
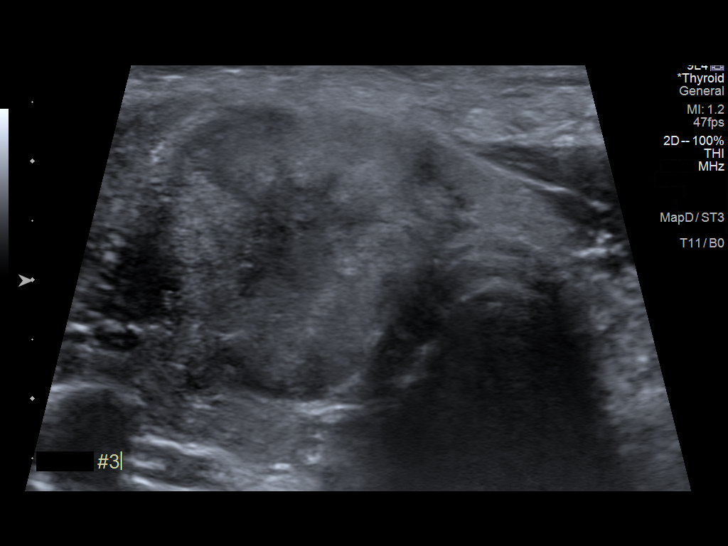
[im 10/13]
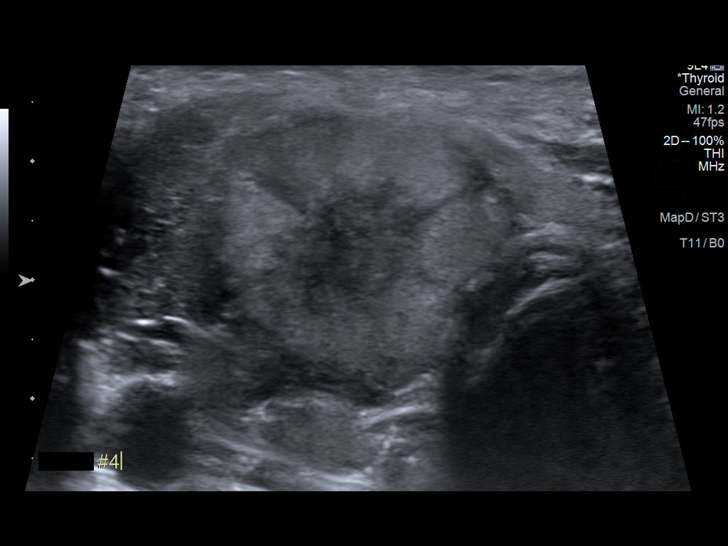
[im 11/13]
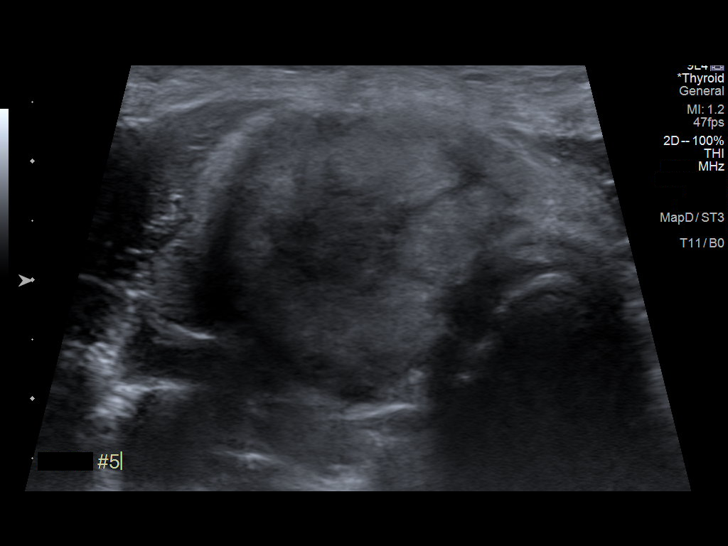
[im 12/13]
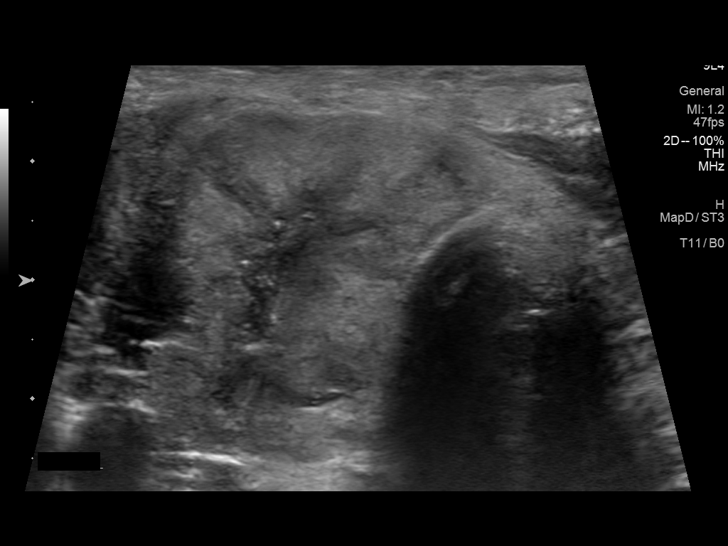
[im 13/13]
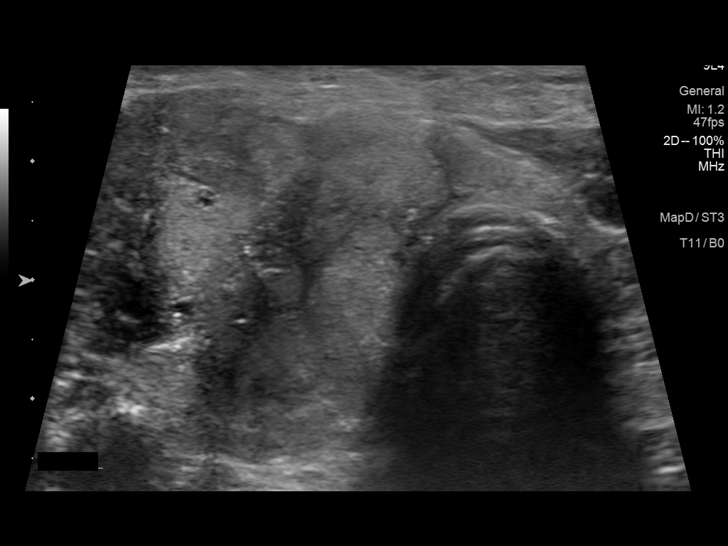

[13 of 13 positions shown; findings below may reference images not displayed]

Pre-procedural ultrasound scanning demonstrated unchanged size and
appearance of the indeterminate nodule within the isthmus

The procedure was planned. The neck was prepped in the usual sterile
fashion, and a sterile drape was applied covering the operative
field. A timeout was performed prior to the initiation of the
procedure. Local anesthesia was provided with 1% lidocaine.

Under direct ultrasound guidance, 5 FNA biopsies were performed of
the isthmus nodule with a 27 gauge needle.

2 of these samples were obtained for AFIRMA

Multiple ultrasound images were saved for procedural documentation
purposes. The samples were prepared and submitted to pathology.

Limited post procedural scanning was negative for hematoma or
additional complication. Dressings were placed. The patient
tolerated the above procedures procedure well without immediate
postprocedural complication.
FINDINGS: Nodule reference number based on prior diagnostic ultrasound: 1

Maximum size: 5.6 cm

Location: Isthmus; Mid

ACR TI-RADS risk category: TR4 (4-6 points)

Reason for biopsy: meets ACR TI-RADS criteria

Ultrasound imaging confirms appropriate placement of the needles
within the thyroid nodule.
IMPRESSION: Technically successful ultrasound guided fine needle aspiration of
isthmus nodule

Read by

Bochao Lawyer

## 2022-06-10 ENCOUNTER — Encounter: Payer: Self-pay | Admitting: Emergency Medicine

## 2022-06-10 ENCOUNTER — Other Ambulatory Visit: Payer: Self-pay

## 2022-06-10 ENCOUNTER — Ambulatory Visit
Admission: EM | Admit: 2022-06-10 | Discharge: 2022-06-10 | Disposition: A | Discharge status: Home / Self Care | Payer: Commercial Managed Care - PPO | Attending: Physician Assistant | Admitting: Physician Assistant

## 2022-06-10 DIAGNOSIS — J069 Acute upper respiratory infection, unspecified: Secondary | ICD-10-CM | POA: Insufficient documentation

## 2022-06-10 DIAGNOSIS — J029 Acute pharyngitis, unspecified: Secondary | ICD-10-CM | POA: Diagnosis present

## 2022-06-10 LAB — POCT RAPID STREP A (OFFICE): Rapid Strep A Screen: NEGATIVE

## 2022-06-10 MED ORDER — FLUTICASONE PROPIONATE 50 MCG/ACT NA SUSP: 2.0000 | Freq: Every day | NASAL | 0 refills | Status: AC

## 2022-06-10 NOTE — ED Provider Notes (Signed)
[facility]    CSN: [id number] Arrival date & time: 06/10/22  1600      History   Chief Complaint Chief Complaint  Patient presents with   Cough    HPI [name] [name] is a 52 y.o. male.   52 year old male presents with congestion and sore throat.  Patient indicates for the past 3 days he has been having some upper respiratory congestion, sinus congestion and maxillary and frontal, nasal congestion, postnasal drip with rhinitis and mainly clear to yellow production.  Patient also indicates he has been having sore throat, painful swallowing that is worse when he gets up from sleeping at night.  He indicates he has been using some Chloraseptic spray to help relieve the throat pain.  He indicates that this has helped a little bit.  Patient also indicates he has had some chest congestion with intermittent cough, production has been clear.  He is without fever, chills, body aches or pain.  He is without wheezing or shortness of breath.  Patient has been taking OTC cold preparations to help control symptoms.  He is tolerating fluids well.   Cough Associated symptoms: sore throat     Past Medical History:  Diagnosis Date   Allergic rhinitis    Arthritis    High cholesterol    Hypertension    Low back pain     Patient Active Problem List   Diagnosis Date Noted   Multinodular goiter 09/03/2020   Pain in joint, pelvic region and thigh 08/17/2015    Past Surgical History:  Procedure Laterality Date   COLONOSCOPY     NO PAST SURGERIES         Home Medications    Prior to Admission medications   Medication Sig Start Date End Date Taking? Authorizing Provider  fluticasone (FLONASE) 50 MCG/ACT nasal spray Place 2 sprays into both nostrils daily. 06/10/22  Yes Nyoka Lint, PA-C  atorvastatin (LIPITOR) 10 MG tablet atorvastatin 10 mg tablet  TAKE ONE TABLET BY MOUTH EVERY DAY    [provider]  benzonatate (TESSALON) 100 MG capsule Take 1  capsule (100 mg total) by mouth every 8 (eight) hours as needed for cough. 06/23/21   Teodora Medici, FNP  celecoxib (CELEBREX) 200 MG capsule Take 1 capsule by mouth 2 (two) times daily as needed.    [provider]  cephALEXin (KEFLEX) 500 MG capsule Take 1 capsule (500 mg total) by mouth 3 (three) times daily. Patient not taking: Reported on 06/10/2022 10/09/21   Evlyn Courier, PA-C  fluticasone [facility]) 50 MCG/ACT nasal spray Place 1 spray into both nostrils daily for 3 days. 06/23/21 06/26/21  Teodora Medici, FNP  lisinopril (ZESTRIL) 20 MG tablet lisinopril 20 mg tablet    [provider]    Family History Family History  Problem Relation Age of Onset   Diabetes Mother    Heart attack Mother    Arthritis Mother    Diabetes Sister    Thyroid disease Neg Hx    Colon cancer Neg Hx    Esophageal cancer Neg Hx    Rectal cancer Neg Hx    Stomach cancer Neg Hx     Social History Social History   Tobacco Use   Smoking status: Former    Types: Cigarettes    Quit date: 2006    Years since quitting: 18.1   Smokeless tobacco: Former    Quit date: 2006  Vaping Use   Vaping Use: Never  used  Substance Use Topics   Alcohol use: Yes    Comment: social   Drug use: Never     Allergies   Patient has no known allergies.   Review of Systems Review of Systems  HENT:  Positive for sore throat.   Respiratory:  Positive for cough.      Physical Exam Triage Vital Signs ED Triage Vitals  Enc Vitals Group     BP 06/10/22 1643 (!) 143/82     Pulse Rate 06/10/22 1643 99     Resp 06/10/22 1643 18     Temp 06/10/22 1643 97.9 F (36.6 C)     Temp Source 06/10/22 1643 Oral     SpO2 06/10/22 1643 97 %     Weight --      Height --      Head Circumference --      Peak Flow --      Pain Score 06/10/22 1644 2     Pain Loc --      Pain Edu? --      Excl. in [place]? --    No data found.  Updated Vital Signs BP (!) 143/82 (BP Location: Left Arm)   Pulse 99   Temp 97.9  F (36.6 C) (Oral)   Resp 18   SpO2 97%   Visual Acuity Right Eye Distance:   Left Eye Distance:   Bilateral Distance:    Right Eye Near:   Left Eye Near:    Bilateral Near:     Physical Exam Constitutional:      Appearance: Normal appearance.  HENT:     Right Ear: Ear canal normal. Tympanic membrane is injected.     Left Ear: Ear canal normal. Tympanic membrane is injected.     Mouth/Throat:     Mouth: Mucous membranes are moist.     Pharynx: Posterior oropharyngeal erythema present. No oropharyngeal exudate.  Cardiovascular:     Rate and Rhythm: Normal rate and regular rhythm.     Heart sounds: Normal heart sounds.  Pulmonary:     Effort: Pulmonary effort is normal.     Breath sounds: Normal breath sounds and air entry. No wheezing, rhonchi or rales.  Lymphadenopathy:     Cervical: Cervical adenopathy (mild anterior adenopathy present bilat) present.  Neurological:     Mental Status: He is alert.      UC Treatments / Results  Labs (all labs ordered are listed, but only abnormal results are displayed) Labs Reviewed  POCT RAPID STREP A (OFFICE)    EKG   Radiology No results found.  Procedures Procedures (including critical care time)  Medications Ordered in UC Medications - No data to display  Initial Impression / Assessment and Plan / UC Course  I have reviewed the triage vital signs and the nursing notes.  Pertinent labs & imaging results that were available during my care of the patient were reviewed by me and considered in my medical decision making (see chart for details).    Plan: The diagnosis will be treated with the following: 1.  Sore throat: A.  Advised to take ibuprofen or Motrin along with lozenges and gargles to help soothe the throat. B.  Throat culture is pending 2.  Upper respiratory infection: A.  Advised to use OTC cough preparations to control the cough and congestion. B.  Advised to take ibuprofen or Motrin along with lozenges  and gargles to help soothe the throat. B.  Flonase nasal spray, 2 sprays  each nostril once a day to help decrease congestion. 3.  Advised follow-up PCP or return to urgent care if symptoms fail to improve. Final Clinical Impressions(s) / UC Diagnoses   Final diagnoses:  Acute upper respiratory infection  Sore throat     Discharge Instructions      Advised to take Tylenol or ibuprofen to help reduce pain and discomfort from the sore throat. Advised to use Chloraseptic spray or lozenges to help soothe the throat. Advised to use OTC cough preparations to control the cough and congestion.  Advised to follow-up PCP or return to urgent care if symptoms fail to improve.     ED Prescriptions     Medication Sig Dispense Auth. Provider   fluticasone (FLONASE) 50 MCG/ACT nasal spray Place 2 sprays into both nostrils daily. 9.9 mL Nyoka Lint, PA-C      PDMP not reviewed this encounter.   Nyoka Lint, PA-C 06/10/22 1705

## 2022-06-10 NOTE — Discharge Instructions (Addendum)
Advised to take Tylenol or ibuprofen to help reduce pain and discomfort from the sore throat. Advised to use Chloraseptic spray or lozenges to help soothe the throat. Advised to use OTC cough preparations to control the cough and congestion.  Advised to follow-up PCP or return to urgent care if symptoms fail to improve.

## 2022-06-10 NOTE — ED Triage Notes (Signed)
Pt here for cough and URI sx x 3 days

## 2022-06-13 LAB — CULTURE, GROUP A STREP (THRC)

## 2022-09-23 DIAGNOSIS — C770 Secondary and unspecified malignant neoplasm of lymph nodes of head, face and neck: Secondary | ICD-10-CM | POA: Insufficient documentation

## 2022-11-22 DIAGNOSIS — R252 Cramp and spasm: Secondary | ICD-10-CM | POA: Insufficient documentation

## 2022-11-22 DIAGNOSIS — G9389 Other specified disorders of brain: Secondary | ICD-10-CM | POA: Insufficient documentation

## 2022-11-22 DIAGNOSIS — F439 Reaction to severe stress, unspecified: Secondary | ICD-10-CM | POA: Insufficient documentation

## 2022-11-22 DIAGNOSIS — R7303 Prediabetes: Secondary | ICD-10-CM | POA: Insufficient documentation

## 2022-12-18 ENCOUNTER — Encounter: Payer: Self-pay | Admitting: Orthopedic Surgery

## 2022-12-18 ENCOUNTER — Ambulatory Visit (INDEPENDENT_AMBULATORY_CARE_PROVIDER_SITE_OTHER): Payer: Commercial Managed Care - PPO | Admitting: Orthopedic Surgery

## 2022-12-18 ENCOUNTER — Other Ambulatory Visit: Payer: Self-pay

## 2022-12-18 DIAGNOSIS — M1611 Unilateral primary osteoarthritis, right hip: Secondary | ICD-10-CM

## 2022-12-18 MED ORDER — LIDOCAINE HCL 1 % IJ SOLN
5.0000 mL | INTRAMUSCULAR | Status: AC | PRN
Start: 2022-12-18 — End: 2022-12-18
  Administered 2022-12-18: 5 mL

## 2022-12-18 MED ORDER — BUPIVACAINE HCL 0.25 % IJ SOLN
4.0000 mL | INTRAMUSCULAR | Status: AC | PRN
Start: 2022-12-18 — End: 2022-12-18
  Administered 2022-12-18: 4 mL via INTRA_ARTICULAR

## 2022-12-18 MED ORDER — HYDROCODONE-ACETAMINOPHEN 5-325 MG PO TABS
1.0000 | ORAL_TABLET | Freq: Four times a day (QID) | ORAL | 0 refills | Status: AC | PRN
Start: 2022-12-18 — End: ?

## 2022-12-18 MED ORDER — METHYLPREDNISOLONE ACETATE 80 MG/ML IJ SUSP
80.0000 mg | INTRAMUSCULAR | Status: AC | PRN
Start: 2022-12-18 — End: 2022-12-18
  Administered 2022-12-18: 80 mg via INTRA_ARTICULAR

## 2022-12-18 NOTE — Progress Notes (Signed)
Office Visit Note   Patient: [name] [name]           Date of Birth: 03-23-1971           MRN: [id number] Visit Date: 12/18/2022 Requested by: Cammy Copa, MD [address] [place],  [place] [address] PCP: Mechele Dawley, PA-C  Subjective: Chief Complaint  Patient presents with   Hip Pain    HPI: [name] LOVFI Jeannine Boga is a 52 y.o. male who presents to the office reporting right hip pain.  Patient has known history of right hip arthritis.  Had prior injection 12/18/2021.  Great relief until a week ago.  No interval history of injury or trauma.  Takes over-the-counter medication at times..                ROS: All systems reviewed are negative as they relate to the chief complaint within the history of present illness.  Patient denies fevers or chills.  Assessment & Plan: Visit Diagnoses:  1. Unilateral primary osteoarthritis, right hip     Plan: Impression is right hip arthritis.  Patient has had 1 prescription of Norco which lasted in the entire year.  Right hip repeat injection performed today.  He will likely need hip replacement at sometime in the future.  He will follow-up with [place] as needed.  Follow-Up Instructions: No follow-ups on file.   Orders:  Orders Placed This Encounter  Procedures   US Guided Needle Placement - No Linked Charges   Meds ordered this encounter  Medications   HYDROcodone-acetaminophen (NORCO/VICODIN) 5-325 MG tablet    Sig: Take 1 tablet by mouth every 6 (six) hours as needed for moderate pain.    Dispense:  30 tablet    Refill:  0      Procedures: Large Joint Inj: R hip joint on 12/18/2022 10:26 PM Indications: pain and diagnostic evaluation Details: 22 G 3.5 in needle, ultrasound-guided lateral approach  Arthrogram: No  Medications: 5 mL lidocaine 1 %; 80 mg methylPREDNISolone acetate 80 MG/ML; 4 mL bupivacaine 0.25 % Outcome: tolerated well, no immediate complications Procedure, treatment alternatives, risks and  benefits explained, specific risks discussed. Consent was given by the patient. Immediately prior to procedure a time out was called to verify the correct patient, procedure, equipment, support staff and site/side marked as required. Patient was prepped and draped in the usual sterile fashion.       Clinical Data: No additional findings.  Objective: Vital Signs: There were no vitals taken for this visit.  Physical Exam:  Constitutional: Patient appears well-developed HEENT:  Head: Normocephalic Eyes:EOM are normal Neck: Normal range of motion Cardiovascular: Normal rate Pulmonary/chest: Effort normal Neurologic: Patient is alert Skin: Skin is warm Psychiatric: Patient has normal mood and affect  Ortho Exam: Ortho exam demonstrates mildly decreased range of motion of the right hip compared to the left.  No trochanteric tenderness is present.  No nerve root tension signs.  Pedal pulses palpable.  Leg lengths approximately equal.  Specialty Comments:  No specialty comments available.  Imaging: US Guided Needle Placement - No Linked Charges  Result Date: 12/18/2022 Ultrasound imaging demonstrates needle placement into the right hip joint with extravasation of fluid and no complicating features    PMFS History: Patient Active Problem List   Diagnosis Date Noted   Multinodular goiter 09/03/2020   Pain in joint, pelvic region and thigh 08/17/2015   Past Medical History:  Diagnosis Date   Allergic rhinitis  Arthritis    High cholesterol    Hypertension    Low back pain     Family History  Problem Relation Age of Onset   Diabetes Mother    Heart attack Mother    Arthritis Mother    Diabetes Sister    Thyroid disease Neg Hx    Colon cancer Neg Hx    Esophageal cancer Neg Hx    Rectal cancer Neg Hx    Stomach cancer Neg Hx     Past Surgical History:  Procedure Laterality Date   COLONOSCOPY     NO PAST SURGERIES     Social History   Occupational History    Occupation: Truck [employer] doctor  Tobacco Use   Smoking status: Former    Current packs/day: 0.00    Types: Cigarettes    Quit date: 2006    Years since quitting: 18.6   Smokeless tobacco: Former    Quit date: 2006  Vaping Use   Vaping status: Never Used  Substance and Sexual Activity   Alcohol use: Yes    Comment: social   Drug use: Never   Sexual activity: Not on file

## 2023-04-17 ENCOUNTER — Ambulatory Visit
Admission: EM | Admit: 2023-04-17 | Discharge: 2023-04-17 | Disposition: A | Discharge status: Home / Self Care | Payer: BC Managed Care – PPO

## 2023-04-17 DIAGNOSIS — I10 Essential (primary) hypertension: Secondary | ICD-10-CM | POA: Insufficient documentation

## 2023-04-17 DIAGNOSIS — J40 Bronchitis, not specified as acute or chronic: Secondary | ICD-10-CM | POA: Diagnosis not present

## 2023-04-17 DIAGNOSIS — J329 Chronic sinusitis, unspecified: Secondary | ICD-10-CM

## 2023-04-17 MED ORDER — AZITHROMYCIN 250 MG PO TABS: 250.0000 mg | ORAL_TABLET | Freq: Every day | ORAL | 0 refills | Status: DC

## 2023-04-17 MED ORDER — PREDNISONE 20 MG PO TABS: 40.0000 mg | ORAL_TABLET | Freq: Every day | ORAL | 0 refills | Status: AC

## 2023-04-17 NOTE — ED Triage Notes (Signed)
"  This started with sore throat 12-31 and hoarse voice, now with cough & feel like something is pinching in my lungs". No sob known. No wheezing known. No fever.

## 2023-04-22 NOTE — ED Provider Notes (Signed)
 [facility]    CSN: [id number] Arrival date & time: 04/17/23  0934      History   Chief Complaint Chief Complaint  Patient presents with   Cough    HPI [name] [name] is a 53 y.o. male.   Patient here today for evaluation of sore throat that started a few days ago he reports that he now has some cough.  He has not had any shortness of breath and wheezing.  He denies any fever.  The history is provided by the patient.  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) 09/23/2022   Sialolithiasis 03/21/2022   Sialadenitis 03/20/2022   Arthritis of left hip 03/01/2022   History of laceration of skin 10/11/2021   Hypocalcemia 06/24/2021   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   Internal hemorrhoids 09/13/2020   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   Polyp of colon 09/13/2020   Multinodular goiter 09/03/2020   Arthritis 08/15/2020   Rectal hemorrhage 08/15/2020   Hyperglycemia 08/06/2020   Hardening of the aorta (main artery of the heart) (HCC) 08/06/2020   Colitis 08/06/2020   Allergic rhinitis 08/06/2020   Osteoarthritis of hip 08/06/2020   Pain in pelvis 08/06/2020   History of sexual behavior with high risk of exposure to communicable disease 08/06/2020   Avascular necrosis (HCC) 08/06/2020   Left lower quadrant pain 07/24/2020   Microscopic hematuria 07/24/2020   Occult blood in stools  07/24/2020   Benign essential hypertension 06/11/2020   Elevated blood-pressure reading without diagnosis of hypertension 06/06/2020   Thyroid  nodule 06/06/2020   Immunization due 06/04/2020   Dyslipidemia 12/17/2017   Family history of coronary arteriosclerosis 12/10/2017   Paresthesia of foot 12/10/2017   Chronic maxillary sinusitis 07/25/2017   Generalized abdominal pain 07/01/2017   Pain in joint, pelvic region and thigh 08/17/2015   Pain in joint of pelvic region 08/17/2015   Bursitis of shoulder 03/29/2015   Chronic low back pain 11/01/2013   Other chronic pain 11/01/2013   Hip pain 08/28/2011   Generalized pain of knee region 08/28/2011    Past Surgical History:  Procedure Laterality Date   COLONOSCOPY     NO PAST SURGERIES         Home Medications    Prior to Admission medications   Medication Sig Start Date End Date Taking? Authorizing Provider  Acetaminophen  (CHLORASEPTIC SORE THROAT PO) Take by mouth.   Yes [provider]  azithromycin  (ZITHROMAX ) 250 MG tablet Take 1 tablet (250 mg total) by mouth daily. Take first 2 tablets together, then 1 every day until finished. 04/17/23  Yes Billy Asberry FALCON, PA-C  Chlorphen-Pseudoephed-APAP [facility] FLU/COLD PO) Take by mouth.   Yes [provider]  predniSONE  (DELTASONE ) 20 MG tablet Take 2 tablets (40 mg total) by mouth daily with breakfast for 5 days. 04/17/23 04/22/23 Yes Billy Asberry FALCON, PA-C  Pseudoeph-Doxylamine-DM-APAP (NYQUIL PO) Take by mouth.   Yes [provider]  Pseudoephedrine -APAP-DM (DAYQUIL PO) Take by mouth.   Yes [provider]  amLODipine (NORVASC) 5 MG tablet Take 5 mg by mouth daily. 07/31/22   [provider]  atorvastatin (LIPITOR) 10 MG tablet atorvastatin 10 mg tablet  TAKE ONE TABLET BY MOUTH EVERY DAY    [provider]  atorvastatin (LIPITOR) 10 MG tablet Take 1 tablet by mouth daily.    [provider]  atorvastatin (LIPITOR) 20 MG  tablet Take 20 mg by mouth daily. 07/31/22   [provider]  Barberry-Oreg Grape-Goldenseal 200-200-50 MG CAPS Take by oral route.    [provider]  benzonatate  (TESSALON ) 100 MG capsule Take 1 capsule (100 mg total) by mouth every 8 (eight) hours as needed for cough. 06/23/21   Hazen Darryle BRAVO, FNP  benzonatate  (TESSALON ) 200 MG capsule Take 200 mg by mouth 3 (three) times daily as needed.    [provider]  calcium carbonate (OS-CAL) 1250 (500 Ca) MG chewable tablet Chew 1 tablet by mouth 3 (three) times daily.    [provider]  Calcium Carbonate 500 MG CHEW Chew by mouth. 06/11/21   [provider]  celecoxib (CELEBREX) 200 MG capsule Take 1 capsule by mouth 2 (two) times daily as needed.    [provider]  fluticasone  (FLONASE ) 50 MCG/ACT nasal spray Place 1 spray into both nostrils daily for 3 days. 06/23/21 06/26/21  Hazen Darryle BRAVO, FNP  fluticasone  (FLONASE ) 50 MCG/ACT nasal spray Place 2 sprays into both nostrils daily. 06/10/22   Lynwood Lenis, PA-C  fluticasone  (FLONASE ) 50 MCG/ACT nasal spray Place 1 spray into both nostrils daily.    [provider]  HYDROcodone -acetaminophen  (NORCO/VICODIN) 5-325 MG tablet Take 1 tablet by mouth every 6 (six) hours as needed for moderate pain. 12/18/22   Addie Cordella Hamilton, MD  HYDROcodone -acetaminophen  (NORCO/VICODIN) 5-325 MG tablet Take 1-2 tablets by mouth every 6 (six) hours as needed for severe pain (pain score 7-10). 06/07/21   [provider]  levothyroxine (SYNTHROID) 150 MCG tablet Take 150 mcg by mouth daily before breakfast. 04/25/22   [provider]  lisinopril (ZESTRIL) 20 MG tablet lisinopril 20 mg tablet    [provider]  meloxicam (MOBIC) 15 MG tablet Take 15 mg by mouth daily. 06/23/19   [provider]  metFORMIN (GLUCOPHAGE) 500 MG tablet Take 500 mg by mouth daily with breakfast. 03/25/22   [provider]  naproxen  (NAPROSYN )  500 MG tablet Take 500 mg by mouth 2 (two) times daily with a meal. 07/13/19   [provider]    Family History Family History  Problem Relation Age of Onset   Diabetes Mother    Heart attack Mother    Arthritis Mother    Diabetes Sister    Thyroid  disease Neg Hx    Colon cancer Neg Hx    Esophageal cancer Neg Hx    Rectal cancer Neg Hx    Stomach cancer Neg Hx     Social History Social History   Tobacco Use   Smoking status: Former    Current packs/day: 0.00    Types: Cigarettes    Quit date: 2006    Years since quitting: 19.0   Smokeless tobacco: Former    Quit date: 2006  Vaping Use   Vaping status: Never Used  Substance Use Topics   Alcohol use: Yes    Comment: Socially.   Drug  use: Never     Allergies   Patient has no known allergies.   Review of Systems Review of Systems  Constitutional:  Negative for chills and fever.  HENT:  Positive for congestion, sore throat and voice change. Negative for ear pain.   Eyes:  Negative for discharge and redness.  Respiratory:  Positive for cough. Negative for shortness of breath.   Gastrointestinal:  Negative for abdominal pain, nausea and vomiting.     Physical Exam Triage Vital Signs ED Triage Vitals  Encounter Vitals Group     BP 04/17/23 1145 (!) 141/94     Systolic BP Percentile --      Diastolic BP Percentile --      Pulse Rate 04/17/23 1145 84     Resp 04/17/23 1145 18     Temp 04/17/23 1145 (!) 97.3 F (36.3 C)     Temp Source 04/17/23 1145 Oral     SpO2 04/17/23 1145 97 %     Weight 04/17/23 1143 225 lb (102.1 kg)     Height 04/17/23 1143 6' 5 (1.956 m)     Head Circumference --      Peak Flow --      Pain Score 04/17/23 1139 0     Pain Loc --      Pain Education --      Exclude from Growth Chart --    No data found.  Updated Vital Signs BP (!) 141/94 (BP Location: Left Arm) Comment: I am on 2 meds, i just took them, It is usually up here.  Pulse 84   Temp (!) 97.3 F (36.3 C)  (Oral)   Resp 18   Ht 6' 5 (1.956 m)   Wt 225 lb (102.1 kg)   SpO2 97%   BMI 26.68 kg/m   Visual Acuity Right Eye Distance:   Left Eye Distance:   Bilateral Distance:    Right Eye Near:   Left Eye Near:    Bilateral Near:     Physical Exam Vitals and nursing note reviewed.  Constitutional:      General: He is not in acute distress.    Appearance: Normal appearance. He is not ill-appearing.  HENT:     Head: Normocephalic and atraumatic.     Right Ear: Tympanic membrane normal.     Left Ear: Tympanic membrane normal.     Nose: Congestion present.     Mouth/Throat:     Mouth: Mucous membranes are moist.     Pharynx: Oropharynx is clear. No oropharyngeal exudate or posterior oropharyngeal erythema.  Eyes:     Conjunctiva/sclera: Conjunctivae normal.  Cardiovascular:     Rate and Rhythm: Normal rate and regular rhythm.     Heart sounds: Normal heart sounds. No murmur heard. Pulmonary:     Effort: Pulmonary effort is normal. No respiratory distress.     Breath sounds: Normal breath sounds. No wheezing, rhonchi or rales.  Skin:    General: Skin is warm and dry.  Neurological:     Mental Status: He is alert.  Psychiatric:        Mood and Affect: Mood normal.        Thought Content: Thought content normal.      UC Treatments / Results  Labs (all labs ordered are listed, but only abnormal results are displayed) Labs Reviewed - No data to display  EKG   Radiology No results found.  Procedures Procedures (including critical care time)  Medications Ordered in UC  Medications - No data to display  Initial Impression / Assessment and Plan / UC Course  I have reviewed the triage vital signs and the nursing notes.  Pertinent labs & imaging results that were available during my care of the patient were reviewed by me and considered in my medical decision making (see chart for details).     Will treat to cover sinobronchitis with steroid burst and Z-Pak.  Advised  follow-up if no gradual improvement with any further concerns.   Final Clinical Impressions(s) / UC Diagnoses   Final diagnoses:  Sinobronchitis   Discharge Instructions   None    ED Prescriptions     Medication Sig Dispense Auth. Provider   predniSONE  (DELTASONE ) 20 MG tablet Take 2 tablets (40 mg total) by mouth daily with breakfast for 5 days. 10 tablet Billy Stabs F, PA-C   azithromycin  (ZITHROMAX ) 250 MG tablet Take 1 tablet (250 mg total) by mouth daily. Take first 2 tablets together, then 1 every day until finished. 6 tablet Billy Stabs FALCON, PA-C      PDMP not reviewed this encounter.   Billy Stabs FALCON, PA-C 04/22/23 1659

## 2023-06-24 ENCOUNTER — Ambulatory Visit: Payer: BC Managed Care – PPO | Admitting: Orthopedic Surgery

## 2023-11-05 ENCOUNTER — Encounter (HOSPITAL_COMMUNITY): Payer: Self-pay | Admitting: *Deleted

## 2023-11-05 ENCOUNTER — Ambulatory Visit (HOSPITAL_COMMUNITY)
Admission: EM | Admit: 2023-11-05 | Discharge: 2023-11-05 | Disposition: A | Discharge status: Home / Self Care | Attending: Family Medicine | Admitting: Family Medicine

## 2023-11-05 DIAGNOSIS — I10 Essential (primary) hypertension: Secondary | ICD-10-CM

## 2023-11-05 DIAGNOSIS — M5441 Lumbago with sciatica, right side: Secondary | ICD-10-CM

## 2023-11-05 DIAGNOSIS — G8929 Other chronic pain: Secondary | ICD-10-CM | POA: Diagnosis not present

## 2023-11-05 HISTORY — DX: Other abnormal glucose: R73.09

## 2023-11-05 MED ORDER — TIZANIDINE HCL 4 MG PO TABS: 4.0000 mg | ORAL_TABLET | Freq: Every evening | ORAL | 0 refills | Status: AC | PRN

## 2023-11-05 MED ORDER — IBUPROFEN 600 MG PO TABS: 600.0000 mg | ORAL_TABLET | Freq: Three times a day (TID) | ORAL | 0 refills | Status: AC | PRN

## 2023-11-05 NOTE — Medical Student Note (Incomplete)
 [employer] [facility] [employer] account manager Note For educational purposes for Medical, PA and NP students only and not part of the legal medical record.   CSN: [id number] Arrival date & time: 11/05/23  1522      History   Chief Complaint Chief Complaint  Patient presents with   Back Pain   Numbness    HPI Courtland Reas Djeyb Willow is a 53 y.o. male.  Patient present today for back pain and lip numbness for 2 weeks. Lip numbness has become constant for last 2 days.  Back pain is worse in the morning, Patient states pain feels like someone is grabbing him, feels like his muscles are tight and spasming.  Pain radiates to R hip and knee. Pain worse after getting out of bed but improves after movement.  Hx of R hip pain.  Reports no medication allergies.  Denies any new foods recently. Tried heat, hydrocodone , massage w/ temporary improvement on back pain.  Took benadryl last night to help sleep, no changes in lip numbness.   Denies chest pain, shortness of breath, nausea, vomiting, diarrhea, cough, fever, chills, abdominal pain, back pain.     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) 09/23/2022   Sialolithiasis 03/21/2022   Sialadenitis 03/20/2022   Arthritis of left hip 03/01/2022   History of laceration of skin 10/11/2021   Hypocalcemia 06/24/2021   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   Internal hemorrhoids 09/13/2020   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   Polyp of colon 09/13/2020   Multinodular goiter 09/03/2020    Arthritis 08/15/2020   Rectal hemorrhage 08/15/2020   Hyperglycemia 08/06/2020   Hardening of the aorta (main artery of the heart) (HCC) 08/06/2020   Colitis 08/06/2020   Allergic rhinitis 08/06/2020   Osteoarthritis of hip 08/06/2020   Pain in pelvis 08/06/2020   History of sexual behavior with high risk of exposure to communicable disease 08/06/2020   Avascular necrosis (HCC) 08/06/2020   Left lower quadrant pain 07/24/2020   Microscopic hematuria 07/24/2020   Occult blood in stools 07/24/2020   Benign essential hypertension 06/11/2020   Elevated blood-pressure reading without diagnosis of hypertension 06/06/2020   Thyroid  nodule 06/06/2020   Immunization due 06/04/2020   Dyslipidemia 12/17/2017   Family history of coronary arteriosclerosis 12/10/2017   Paresthesia of foot 12/10/2017   Chronic maxillary sinusitis 07/25/2017   Generalized abdominal pain 07/01/2017   Pain in joint, pelvic region and thigh 08/17/2015   Pain in joint of pelvic region 08/17/2015   Bursitis of shoulder 03/29/2015   Chronic low back pain 11/01/2013   Other chronic pain 11/01/2013   Hip pain 08/28/2011   Generalized pain of knee region 08/28/2011    Past Surgical History:  Procedure Laterality Date   COLONOSCOPY     NO PAST SURGERIES         Home Medications    Prior to Admission medications   Medication Sig Start Date End Date Taking? Authorizing Provider  amLODipine (NORVASC)  5 MG tablet Take 5 mg by mouth daily. 07/31/22  Yes [provider]  atorvastatin (LIPITOR) 20 MG tablet Take 20 mg by mouth daily. 07/31/22  Yes [provider]  HYDROcodone -acetaminophen  (NORCO/VICODIN) 5-325 MG tablet Take 1 tablet by mouth every 6 (six) hours as needed for moderate pain. 12/18/22  Yes Addie Cordella Hamilton, MD  levothyroxine (SYNTHROID) 150 MCG tablet Take 150 mcg by mouth daily before breakfast. 04/25/22  Yes [provider]  metFORMIN (GLUCOPHAGE) 500 MG tablet Take 500 mg by  mouth daily with breakfast. 03/25/22  Yes [provider]  Acetaminophen  (CHLORASEPTIC SORE THROAT PO) Take by mouth.    [provider]  atorvastatin (LIPITOR) 10 MG tablet atorvastatin 10 mg tablet  TAKE ONE TABLET BY MOUTH EVERY DAY    [provider]  atorvastatin (LIPITOR) 10 MG tablet Take 1 tablet by mouth daily.    [provider]  azithromycin  (ZITHROMAX ) 250 MG tablet Take 1 tablet (250 mg total) by mouth daily. Take first 2 tablets together, then 1 every day until finished. 04/17/23   Billy Asberry FALCON, PA-C  Barberry-Oreg Grape-Goldenseal 200-200-50 MG CAPS Take by oral route.    [provider]  benzonatate  (TESSALON ) 100 MG capsule Take 1 capsule (100 mg total) by mouth every 8 (eight) hours as needed for cough. 06/23/21   Hazen Darryle BRAVO, FNP  benzonatate  (TESSALON ) 200 MG capsule Take 200 mg by mouth 3 (three) times daily as needed.    [provider]  calcium carbonate (OS-CAL) 1250 (500 Ca) MG chewable tablet Chew 1 tablet by mouth 3 (three) times daily.    [provider]  Calcium Carbonate 500 MG CHEW Chew by mouth. 06/11/21   [provider]  celecoxib (CELEBREX) 200 MG capsule Take 1 capsule by mouth 2 (two) times daily as needed.    [provider]  Chlorphen-Pseudoephed-APAP (THERAFLU FLU/COLD PO) Take by mouth.    [provider]  fluticasone  (FLONASE ) 50 MCG/ACT nasal spray Place 1 spray into both nostrils daily for 3 days. 06/23/21 06/26/21  Hazen Darryle BRAVO, FNP  fluticasone  (FLONASE ) 50 MCG/ACT nasal spray Place 2 sprays into both nostrils daily. 06/10/22   Lynwood Lenis, PA-C  fluticasone  (FLONASE ) 50 MCG/ACT nasal spray Place 1 spray into both nostrils daily.    [provider]  HYDROcodone -acetaminophen  (NORCO/VICODIN) 5-325 MG tablet Take 1-2 tablets by mouth every 6 (six) hours as needed for severe pain (pain score 7-10). 06/07/21   [provider]  lisinopril (ZESTRIL) 20  MG tablet lisinopril 20 mg tablet    [provider]  meloxicam (MOBIC) 15 MG tablet Take 15 mg by mouth daily. 06/23/19   [provider]  naproxen  (NAPROSYN ) 500 MG tablet Take 500 mg by mouth 2 (two) times daily with a meal. 07/13/19   [provider]  Pseudoeph-Doxylamine-DM-APAP (NYQUIL PO) Take by mouth.    [provider]  Pseudoephedrine -APAP-DM (DAYQUIL PO) Take by mouth.    [provider]    Family History Family History  Problem Relation Age of Onset   Diabetes Mother    Heart attack Mother    Arthritis Mother    Diabetes Sister    Thyroid  disease Neg Hx    Colon cancer Neg Hx    Esophageal cancer Neg Hx    Rectal cancer Neg Hx    Stomach cancer Neg Hx     Social History Social History   Tobacco Use   Smoking status: Former  Current packs/day: 0.00    Types: Cigarettes    Quit date: 2006    Years since quitting: 19.5   Smokeless tobacco: Former    Quit date: 2006  Vaping Use   Vaping status: Never Used  Substance Use Topics   Alcohol use: Yes    Comment: Socially.   Drug use: Never     Allergies   Patient has no known allergies.   Review of Systems Review of Systems  Musculoskeletal:  Positive for back pain.     Physical Exam Updated Vital Signs BP (!) 170/109   Pulse 100   Temp 98 F (36.7 C) (Oral)   Resp 16   SpO2 96%   Physical Exam   ED Treatments / Results  Labs (all labs ordered are listed, but only abnormal results are displayed) Labs Reviewed - No data to display  EKG  Radiology No results found.  Procedures Procedures (including critical care time)  Medications Ordered in ED Medications - No data to display   Initial Impression / Assessment and Plan / ED Course  I have reviewed the triage vital signs and the nursing notes.  Pertinent labs & imaging results that were available during my care of the patient were reviewed by me and considered in my medical decision making  (see chart for details).     ***  Final Clinical Impressions(s) / ED Diagnoses   Final diagnoses:  None    New Prescriptions New Prescriptions   No medications on file

## 2023-11-05 NOTE — ED Triage Notes (Addendum)
 C/O low back pain radiating down into RLE. States worse when waking and getting out of bed. States went to ortho for right hip pain couple months ago.  C/O transient upper and lower lip tingling onset 2 wks ago.  States taking HTN med - last dose this AM -- but is only taking one of them (states supposed to be taking 2 meds for HTN).

## 2023-11-05 NOTE — Discharge Instructions (Signed)
  Take tizanidine  4 mg--1 at bedtime as needed for muscle spasms; this medication can cause dizziness and sleepiness  Take ibuprofen  600 mg--1 tab every 8 hours as needed for pain.  Please follow-up with your primary care about these issues.

## 2023-11-05 NOTE — ED Provider Notes (Signed)
 [facility]    CSN: [id number] Arrival date & time: 11/05/23  1522      History   Chief Complaint Chief Complaint  Patient presents with   Back Pain   Numbness    HPI [name] [name] is a 53 y.o. male.    Back Pain  Here for low back pain that has been bothering him for a while and is worse lately.  It bothers him worse when he wakens in the morning.  No recent trauma or fall.  No bowel or bladder incontinence  No fever or dysuria or hematuria.  He does have some intermittent lip tingling but he does not have it currently.  He takes a medication for his hypertension.  At home his blood pressures run 140s to 170s systolic.  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) 09/23/2022   Sialolithiasis 03/21/2022   Sialadenitis 03/20/2022   Arthritis of left hip 03/01/2022   History of laceration of skin 10/11/2021   Hypocalcemia 06/24/2021   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   Internal hemorrhoids 09/13/2020   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   Polyp of colon 09/13/2020   Multinodular goiter 09/03/2020   Arthritis 08/15/2020   Rectal hemorrhage 08/15/2020   Hyperglycemia 08/06/2020   Hardening of the aorta (main artery of the heart) (HCC) 08/06/2020   Colitis 08/06/2020   Allergic rhinitis 08/06/2020   Osteoarthritis of hip 08/06/2020   Pain in pelvis 08/06/2020   History of sexual behavior with high risk of exposure to communicable disease  08/06/2020   Avascular necrosis (HCC) 08/06/2020   Left lower quadrant pain 07/24/2020   Microscopic hematuria 07/24/2020   Occult blood in stools 07/24/2020   Benign essential hypertension 06/11/2020   Elevated blood-pressure reading without diagnosis of hypertension 06/06/2020   Thyroid  nodule 06/06/2020   Immunization due 06/04/2020   Dyslipidemia 12/17/2017   Family history of coronary arteriosclerosis 12/10/2017   Paresthesia of foot 12/10/2017   Chronic maxillary sinusitis 07/25/2017   Generalized abdominal pain 07/01/2017   Pain in joint, pelvic region and thigh 08/17/2015   Pain in joint of pelvic region 08/17/2015   Bursitis of shoulder 03/29/2015   Chronic low back pain 11/01/2013   Other chronic pain 11/01/2013   Hip pain 08/28/2011   Generalized pain of knee region 08/28/2011    Past Surgical History:  Procedure Laterality Date   COLONOSCOPY     NO PAST SURGERIES         Home Medications    Prior to Admission medications   Medication Sig Start Date End Date Taking? Authorizing Provider  amLODipine (NORVASC) 5 MG tablet Take 5 mg by mouth daily. 07/31/22  Yes [provider]  atorvastatin (LIPITOR) 20 MG tablet Take 20 mg by mouth daily. 07/31/22  Yes [provider]  HYDROcodone -acetaminophen  (NORCO/VICODIN) 5-325 MG tablet Take 1 tablet by mouth every 6 (six) hours as needed for moderate pain. 12/18/22  Yes Addie Cordella Hamilton, MD  ibuprofen  (ADVIL ) 600 MG tablet Take 1 tablet (600 mg total) by mouth every 8 (eight) hours as needed (pain). 11/05/23  Yes Allison Deshotels K, MD  levothyroxine (SYNTHROID) 150 MCG tablet Take 150 mcg by mouth daily before breakfast. 04/25/22  Yes [provider]  metFORMIN (GLUCOPHAGE) 500 MG tablet Take 500 mg by mouth daily with breakfast. 03/25/22  Yes [provider]  tiZANidine  (ZANAFLEX ) 4 MG tablet Take 1 tablet (4 mg total) by mouth at bedtime as needed for muscle spasms. 11/05/23  Yes Vonna Sharlet POUR, MD  Acetaminophen  (CHLORASEPTIC SORE THROAT PO) Take by mouth.    [provider]  atorvastatin (LIPITOR) 10 MG tablet atorvastatin 10 mg tablet  TAKE ONE TABLET BY MOUTH EVERY DAY    [provider]  atorvastatin (LIPITOR) 10 MG tablet Take 1 tablet by mouth daily.    [provider]  Barberry-Oreg Grape-Goldenseal 200-200-50 MG CAPS Take by oral route.    [provider]  calcium carbonate (OS-CAL) 1250 (500 Ca) MG chewable tablet Chew 1 tablet by mouth 3 (three) times daily.    [provider]  Calcium Carbonate 500 MG CHEW Chew by mouth. 06/11/21   [provider]  Chlorphen-Pseudoephed-APAP (THERAFLU FLU/COLD PO) Take by mouth.    [provider]  fluticasone  (FLONASE ) 50 MCG/ACT nasal spray Place 1 spray into both nostrils daily for 3 days. 06/23/21 06/26/21  Hazen Darryle BRAVO, FNP  fluticasone  (FLONASE ) 50 MCG/ACT nasal spray Place 2 sprays into both nostrils daily. 06/10/22   Lynwood Lenis, PA-C  fluticasone  (FLONASE ) 50 MCG/ACT nasal spray Place 1 spray into both nostrils daily.    [provider]  HYDROcodone -acetaminophen  (NORCO/VICODIN) 5-325 MG tablet Take 1-2 tablets by mouth every 6 (six) hours as needed for severe pain (pain score 7-10). 06/07/21   [provider]  lisinopril (ZESTRIL) 20 MG tablet lisinopril 20 mg tablet    [provider]  naproxen  (NAPROSYN ) 500 MG tablet Take 500 mg by mouth 2 (two) times daily with a meal. 07/13/19   [provider]  Pseudoeph-Doxylamine-DM-APAP (NYQUIL PO) Take by mouth.    [provider]  Pseudoephedrine -APAP-DM (DAYQUIL PO) Take by mouth.    [provider]    Family History Family History  Problem Relation Age of Onset   Diabetes Mother    Heart attack Mother    Arthritis Mother    Diabetes Sister    Thyroid  disease Neg Hx    Colon cancer Neg Hx    Esophageal cancer Neg Hx    Rectal cancer Neg Hx    Stomach  cancer Neg Hx     Social History Social History   Tobacco Use   Smoking status: Former    Current packs/day: 0.00    Types: Cigarettes    Quit date: 2006    Years since quitting: 19.5   Smokeless tobacco: Former    Quit date: 2006  Vaping Use   Vaping status: Never Used  Substance Use Topics   Alcohol use: Yes    Comment: Socially.   Drug use: Never     Allergies   Patient has no known allergies.   Review of Systems Review of Systems  Musculoskeletal:  Positive for back pain.     Physical Exam Triage Vital Signs ED Triage Vitals  Encounter Vitals Group     BP 11/05/23 1539 (!) 169/110     Girls Systolic BP Percentile --  Girls Diastolic BP Percentile --      Boys Systolic BP Percentile --      Boys Diastolic BP Percentile --      Pulse Rate 11/05/23 1539 100     Resp 11/05/23 1539 16     Temp 11/05/23 1539 98 F (36.7 C)     Temp Source 11/05/23 1539 Oral     SpO2 11/05/23 1539 96 %     Weight --      Height --      Head Circumference --      Peak Flow --      Pain Score 11/05/23 1541 9     Pain Loc --      Pain Education --      Exclude from Growth Chart --    No data found.  Updated Vital Signs BP (!) 170/109   Pulse 100   Temp 98 F (36.7 C) (Oral)   Resp 16   SpO2 96%   Visual Acuity Right Eye Distance:   Left Eye Distance:   Bilateral Distance:    Right Eye Near:   Left Eye Near:    Bilateral Near:     Physical Exam Vitals reviewed.  Constitutional:      General: He is not in acute distress.    Appearance: He is not ill-appearing, toxic-appearing or diaphoretic.  HENT:     Mouth/Throat:     Mouth: Mucous membranes are moist.  Eyes:     Extraocular Movements: Extraocular movements intact.     Pupils: Pupils are equal, round, and reactive to light.  Cardiovascular:     Rate and Rhythm: Normal rate and regular rhythm.     Heart sounds: No murmur heard. Pulmonary:     Effort: Pulmonary effort is normal.     Breath  sounds: Normal breath sounds.  Musculoskeletal:     Cervical back: Neck supple.     Comments: Straight leg raise is negative at this time.  Lymphadenopathy:     Cervical: No cervical adenopathy.  Skin:    Coloration: Skin is not pale.  Neurological:     General: No focal deficit present.     Mental Status: He is alert and oriented to person, place, and time.  Psychiatric:        Behavior: Behavior normal.      UC Treatments / Results  Labs (all labs ordered are listed, but only abnormal results are displayed) Labs Reviewed - No data to display  EKG   Radiology No results found.  Procedures Procedures (including critical care time)  Medications Ordered in UC Medications - No data to display  Initial Impression / Assessment and Plan / UC Course  I have reviewed the triage vital signs and the nursing notes.  Pertinent labs & imaging results that were available during my care of the patient were reviewed by me and considered in my medical decision making (see chart for details).     Last EGFR I can find was from 2023 in care everywhere and was 103.  Initially had Celebrex and meloxicam in his med list, but he states he is taking neither 1 of those and is not taking any other over-the-counter anti-inflammatories.  Ibuprofen  600 mg is sent in for the pain and some tizanidine  is sent in, at least for nighttime use.  I have asked him to continue checking his blood pressures and keep a log for his primary care.  He will follow-up with his primary care  soon. Final Clinical Impressions(s) / UC Diagnoses   Final diagnoses:  Chronic bilateral low back pain with right-sided sciatica  Essential hypertension     Discharge Instructions       Take tizanidine  4 mg--1 at bedtime as needed for muscle spasms; this medication can cause dizziness and sleepiness  Take ibuprofen  600 mg--1 tab every 8 hours as needed for pain.  Please follow-up with your primary care about these  issues.     ED Prescriptions     Medication Sig Dispense Auth. Provider   tiZANidine  (ZANAFLEX ) 4 MG tablet Take 1 tablet (4 mg total) by mouth at bedtime as needed for muscle spasms. 10 tablet Vonna Sharlet POUR, MD   ibuprofen  (ADVIL ) 600 MG tablet Take 1 tablet (600 mg total) by mouth every 8 (eight) hours as needed (pain). 15 tablet Heidee Audi K, MD      PDMP not reviewed this encounter.   Vonna Sharlet POUR, MD 11/05/23 [phone]

## 2023-11-11 ENCOUNTER — Ambulatory Visit: Admitting: Orthopedic Surgery

## 2023-11-16 ENCOUNTER — Ambulatory Visit: Admitting: Orthopedic Surgery

## 2023-11-27 ENCOUNTER — Ambulatory Visit: Admitting: Orthopedic Surgery

## 2023-12-03 ENCOUNTER — Other Ambulatory Visit: Payer: Self-pay

## 2023-12-03 ENCOUNTER — Ambulatory Visit: Admitting: Orthopedic Surgery

## 2023-12-03 ENCOUNTER — Encounter: Payer: Self-pay | Admitting: Orthopedic Surgery

## 2023-12-03 DIAGNOSIS — M549 Dorsalgia, unspecified: Secondary | ICD-10-CM

## 2023-12-05 ENCOUNTER — Encounter: Payer: Self-pay | Admitting: Orthopedic Surgery

## 2023-12-05 NOTE — Progress Notes (Signed)
 Office Visit Note   Patient: [name] [name]           Date of Birth: 05-11-1970           MRN: [id number] Visit Date: 12/03/2023 Requested by: Winfred Pilsner, PA-C [address] [place],  [place] [address] PCP: Winfred Pilsner, PA-C  Subjective: Chief Complaint  Patient presents with   Lower Back - Pain    HPI: [name] [name] is a 52 y.o. male who presents to the office reporting low back pain of 6 weeks duration.  Acute onset with no history of injury.  Describes radicular pain down the right leg with associated numbness and tingling.  Pain wakes him from sleep at night.  Sleeps with a pillow between his legs.  Reports buttock pain as well as lateral leg pain.  Has a known history of right hip arthritis which he states is different than what he has now.  Muscle relaxer helped.  Had prior right hip injection on 12/18/2022.  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  Ibuprofen  helps his symptoms as well.  The pain does radiate at times to the plantar aspect of the right foot.  Has not had any medications in the last 2 days have been improved.  Nonetheless it is very difficult for him to walk some days.  Symptoms are worse right when he gets out of bed in the morning..                ROS: All systems reviewed are negative as they relate to the chief complaint within the history of present illness.  Patient denies fevers or chills.  Assessment & Plan: Visit Diagnoses:  1. Back pain, unspecified back location, unspecified back pain laterality, unspecified chronicity     Plan: Impression is right-sided radiculopathy of 6 weeks duration with failure of conservative treatment.  Plan MRI lumbar spine to evaluate right-sided radiculopathy with possible ESI's to follow.  Follow-Up Instructions: No follow-ups on file.   Orders:  Orders Placed This Encounter  Procedures   XR Lumbar Spine 2-3 Views   MR  Lumbar Spine w/o contrast   No orders of the defined types were placed in this encounter.     Procedures: No procedures performed   Clinical Data: No additional findings.  Objective: Vital Signs: There were no vitals taken for this visit.  Physical Exam:  Constitutional: Patient appears well-developed HEENT:  Head: Normocephalic Eyes:EOM are normal Neck: Normal range of motion Cardiovascular: Normal rate Pulmonary/chest: Effort normal Neurologic: Patient is alert Skin: Skin is warm Psychiatric: Patient has normal mood and affect  Ortho Exam: Patient has bilateral 5 out of 5 ankle dorsiflexion plantarflexion quad hamstring and hip flexion strength.  Bilateral palpable pedal pulses and no paresthesias L1-S1 in either leg.  No nerve root tension signs.  Minimal pain with forward and lateral bending.  No groin pain with internal and external rotation of either leg.  No trochanteric tenderness.  No masses lymphadenopathy or skin changes around the back.   Specialty Comments:  No specialty comments available.  Imaging: No results found.   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   Internal hemorrhoids 09/13/2020   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   Polyp of colon 09/13/2020   Multinodular goiter 09/03/2020   Arthritis 08/15/2020   Rectal hemorrhage 08/15/2020   Hyperglycemia 08/06/2020   Hardening of the aorta (main artery of the heart) (HCC) 08/06/2020   Colitis  08/06/2020   Allergic rhinitis 08/06/2020   Osteoarthritis of hip 08/06/2020   Pain in pelvis 08/06/2020   History of sexual behavior with high risk of exposure to communicable disease 08/06/2020   Avascular necrosis (HCC) 08/06/2020   Left lower quadrant pain 07/24/2020   Microscopic hematuria 07/24/2020   Occult blood in stools 07/24/2020   Benign essential hypertension 06/11/2020   Elevated blood-pressure reading without diagnosis of hypertension 06/06/2020   Thyroid  nodule 06/06/2020   Immunization due 06/04/2020   Dyslipidemia 12/17/2017   Family history of coronary arteriosclerosis 12/10/2017   Paresthesia of foot 12/10/2017   Chronic maxillary sinusitis 07/25/2017   Generalized abdominal pain 07/01/2017   Pain in joint, pelvic region and thigh 08/17/2015   Pain in joint of pelvic region 08/17/2015   Bursitis of shoulder 03/29/2015   Chronic low back pain 11/01/2013   Other chronic pain 11/01/2013   Hip pain 08/28/2011   Generalized pain of knee region 08/28/2011   Past Medical History:  Diagnosis Date   Allergic rhinitis    Arthritis    Elevated hemoglobin A1c    High cholesterol    Hypertension    Low back pain     Family History  Problem Relation Age of Onset   Diabetes Mother    Heart attack Mother    Arthritis Mother    Diabetes Sister    Thyroid  disease Neg Hx    Colon cancer Neg Hx    Esophageal cancer Neg Hx    Rectal cancer Neg Hx    Stomach cancer Neg Hx     Past Surgical History:  Procedure Laterality Date   COLONOSCOPY     NO PAST SURGERIES     Social History   Occupational History   Occupation: [employer] doctor  Tobacco Use   Smoking status: Former    Current packs/day: 0.00    Types: Cigarettes    Quit date: 2006    Years since quitting: 19.6   Smokeless tobacco: Former    Quit date: 2006  Vaping Use   Vaping status: Never Used  Substance and Sexual Activity   Alcohol use: Yes    Comment: Socially.   Drug use: Never   Sexual  activity: Not on file

## 2023-12-10 ENCOUNTER — Encounter: Payer: Self-pay | Admitting: Orthopedic Surgery

## 2023-12-13 ENCOUNTER — Ambulatory Visit
Admission: RE | Admit: 2023-12-13 | Discharge: 2023-12-13 | Disposition: A | Discharge status: Home / Self Care | Source: Ambulatory Visit | Attending: Orthopedic Surgery | Admitting: Orthopedic Surgery

## 2023-12-13 DIAGNOSIS — M549 Dorsalgia, unspecified: Secondary | ICD-10-CM

## 2023-12-16 ENCOUNTER — Ambulatory Visit: Payer: Self-pay | Admitting: Orthopedic Surgery

## 2023-12-16 NOTE — Telephone Encounter (Signed)
 Per Dr Addie no order needed now but patient will call us  or send mychart message when he wants repeat ESI

## 2023-12-16 NOTE — Progress Notes (Signed)
 I called him about the results.  He does have some L5-S1 edema in the disc.  I think that he would do well get an injection with Dr. Eldonna or with [facility] whoever can get him in fastest when he becomes symptomatic again.  Has not hurt him for the last 3 days.

## 2023-12-16 NOTE — Telephone Encounter (Signed)
-----   Message from KANDICE Glendia Hutchinson sent at 12/16/2023  4:23 PM EDT ----- I called him about the results.  He does have some L5-S1 edema in the disc.  I think that he would do well get an injection with Dr. Eldonna or with [facility] whoever can get him in fastest when he becomes  symptomatic again.  Has not hurt him for the last 3 days. ----- Message ----- From: Interface, Rad Results In Sent: 12/15/2023   5:00 PM EDT To: Cordella Glendia Hutchinson, MD

## 2024-02-15 ENCOUNTER — Encounter: Payer: Self-pay | Admitting: Radiology
